# Patient Record
Sex: Female | Born: 1990 | Race: Black or African American | Hispanic: No | Marital: Single | Smoking: Current some day smoker
Health system: Southern US, Community
[De-identification: ages and names within clinical notes are randomized; demographics above are authoritative.]

## PROBLEM LIST (undated history)

## (undated) DIAGNOSIS — F909 Attention-deficit hyperactivity disorder, unspecified type: Secondary | ICD-10-CM

## (undated) DIAGNOSIS — F319 Bipolar disorder, unspecified: Secondary | ICD-10-CM

## (undated) DIAGNOSIS — G4700 Insomnia, unspecified: Secondary | ICD-10-CM

## (undated) DIAGNOSIS — N946 Dysmenorrhea, unspecified: Secondary | ICD-10-CM

## (undated) DIAGNOSIS — Z7689 Persons encountering health services in other specified circumstances: Secondary | ICD-10-CM

## (undated) HISTORY — DX: Persons encountering health services in other specified circumstances: Z76.89

## (undated) HISTORY — DX: Dysmenorrhea, unspecified: N94.6

## (undated) HISTORY — DX: Insomnia, unspecified: G47.00

## (undated) HISTORY — DX: Bipolar disorder, unspecified: F31.9

---
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
## 2008-09-10 ENCOUNTER — Emergency Department (HOSPITAL_COMMUNITY): Admission: EM | Admit: 2008-09-10 | Discharge: 2008-09-10 | Payer: Self-pay | Admitting: Emergency Medicine

## 2008-12-25 IMAGING — US US ABDOMEN COMPLETE
1 series · 14 of 25 positions shown · non-contrast
Comparison: None.

CLINICAL DATA: Vomiting.
 ABDOMEN ULTRASOUND:
TECHNIQUE: Complete abdominal ultrasound examination was performed including evaluation of the liver, gallbladder, bile ducts, pancreas, kidneys, spleen, IVC, and abdominal aorta.

[Series 1: unknown · 0.28mm/px · 14 of 57 slices shown]
[im 1/57]
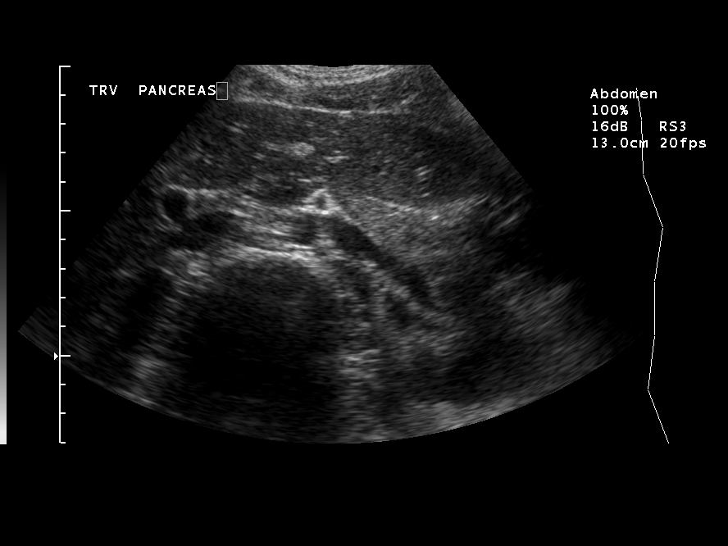
[im 5/57]
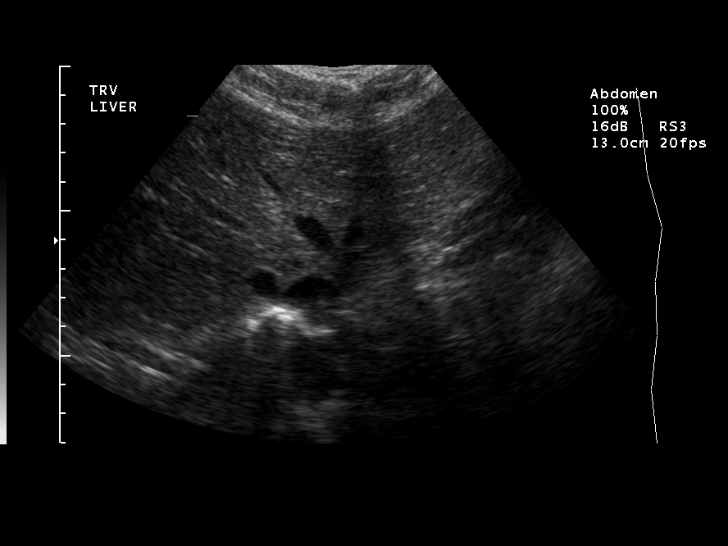
[im 10/57]
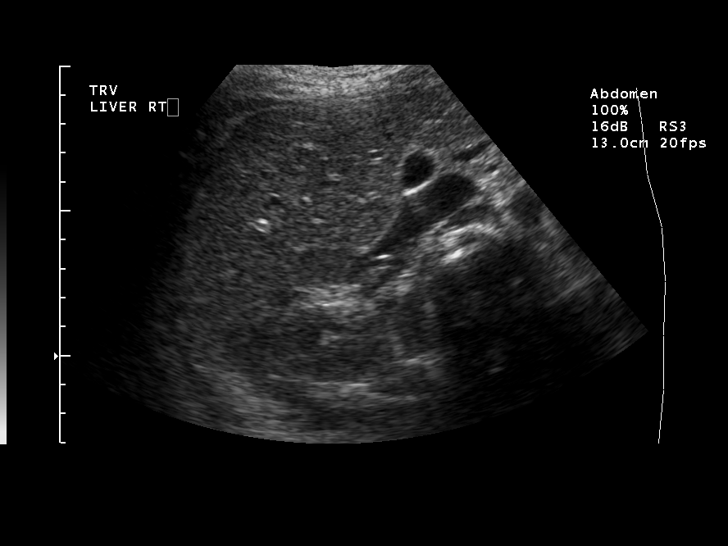
[im 15/57]
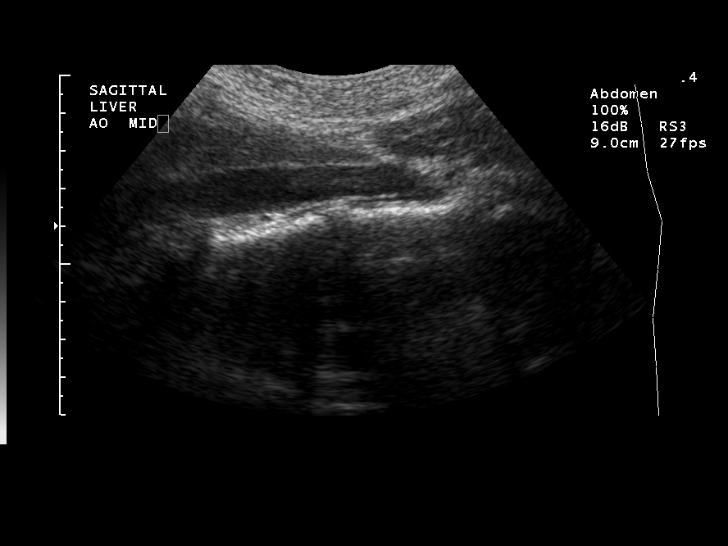
[im 19/57]
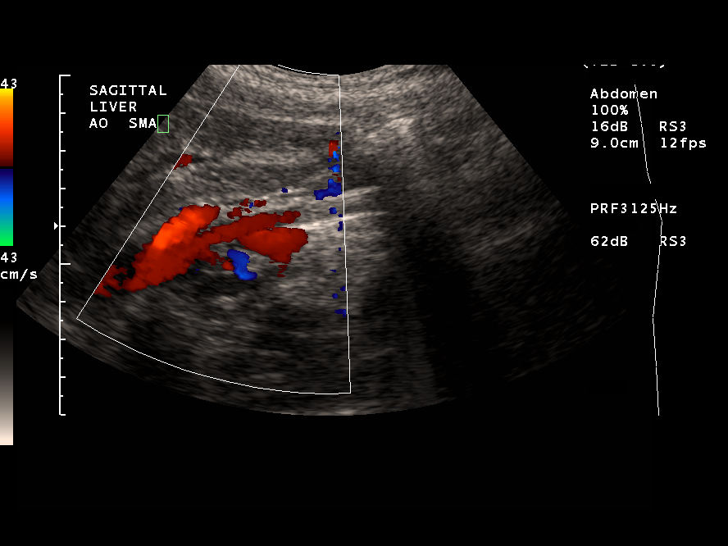
[im 22/57]
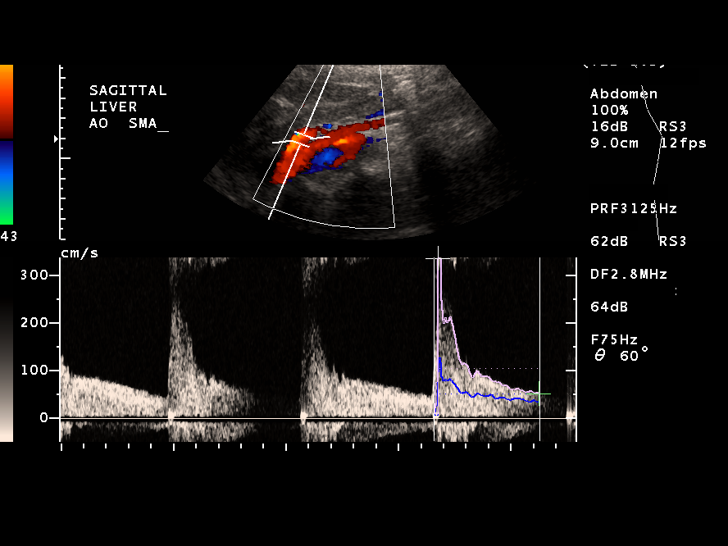
[im 26/57]
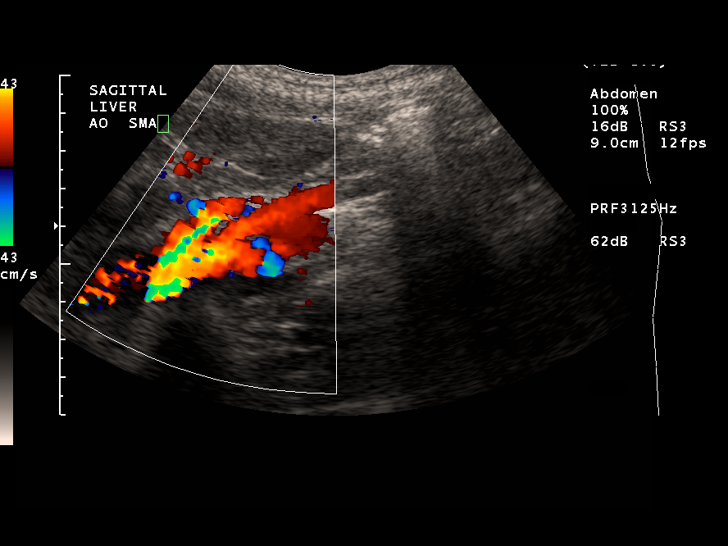
[im 31/57]
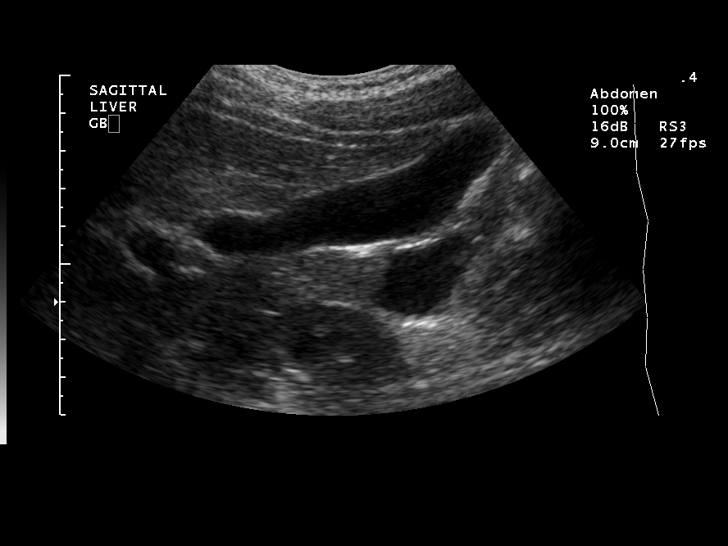
[im 36/57]
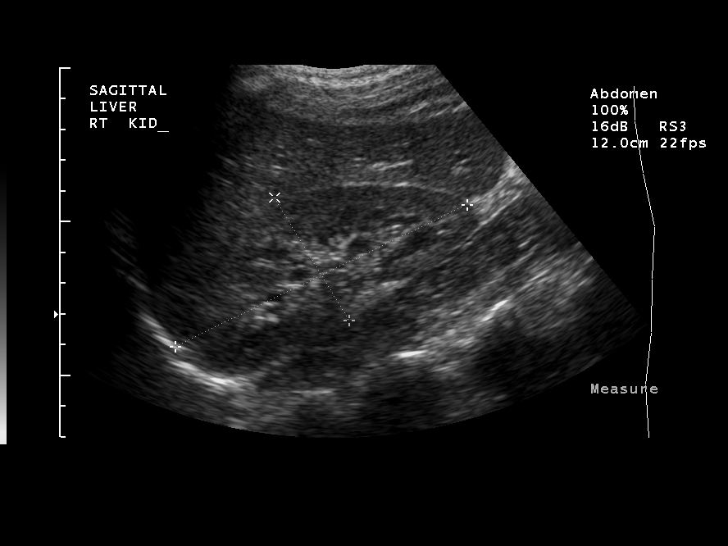
[im 38/57]
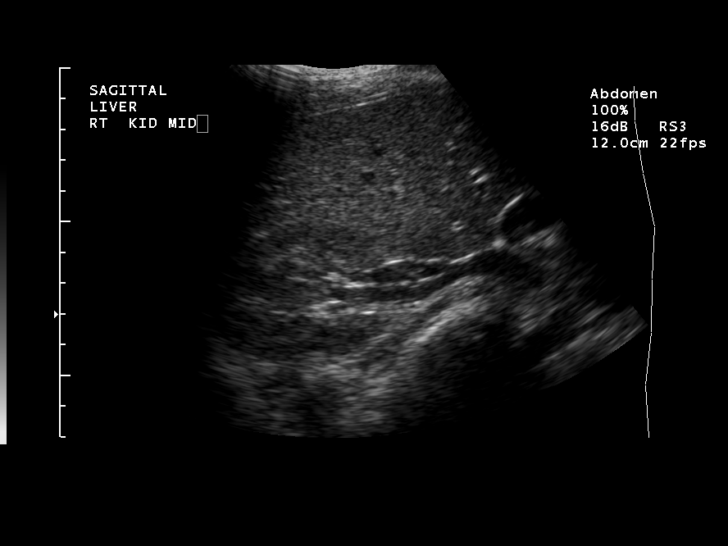
[im 43/57]
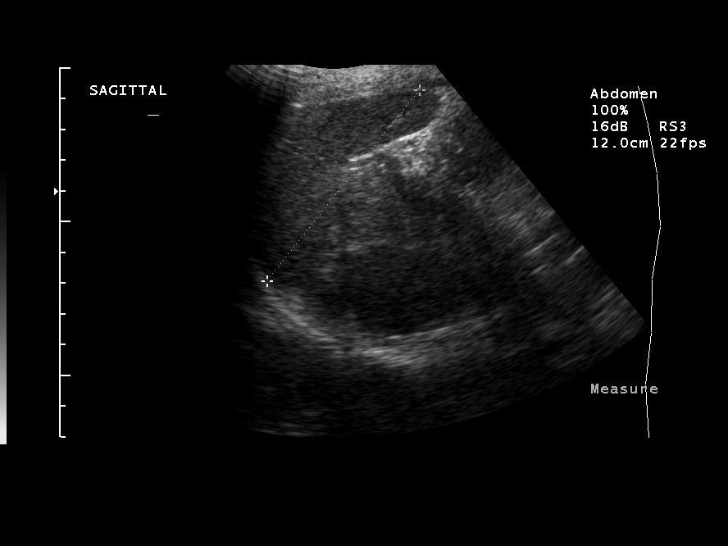
[im 47/57]
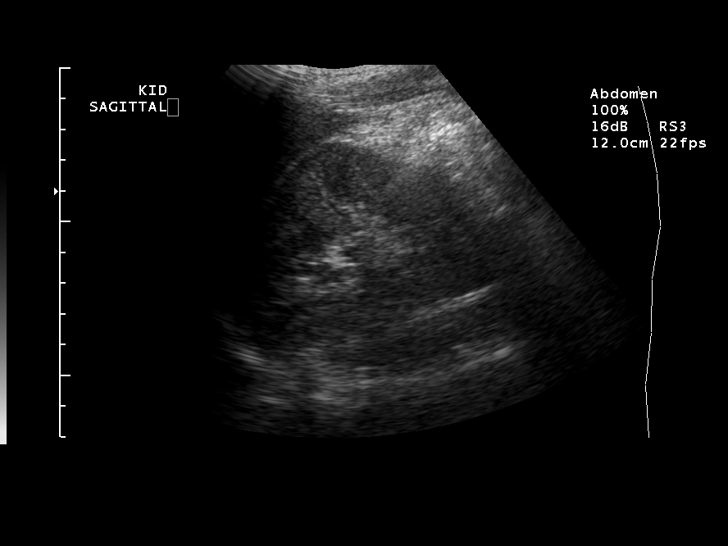
[im 52/57]
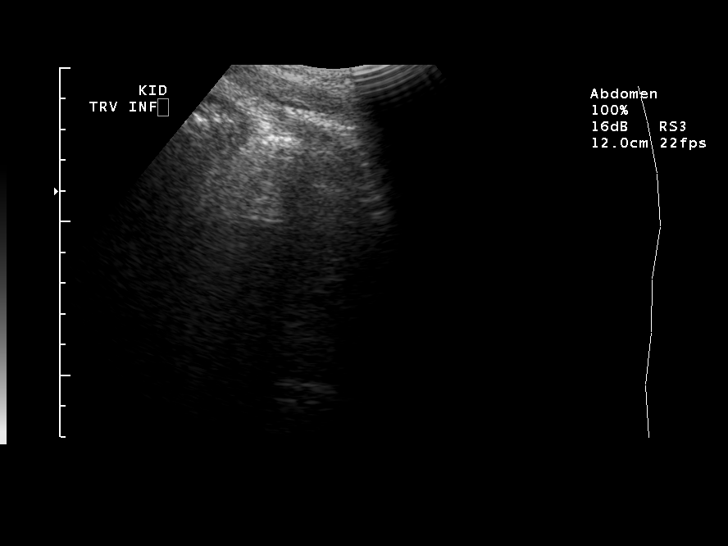
[im 57/57]
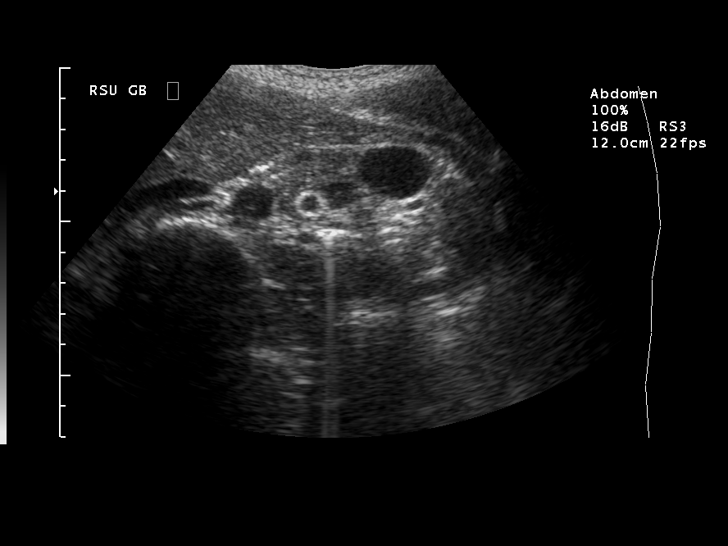

[14 of 25 positions shown; findings below may reference images not displayed]

FINDINGS: Gallbladder and bile ducts are normal.  The common duct is about 2.2 mm.  The liver, spleen, pancreas and kidneys are sonographically normal.  The aorta and IVC are normal.  No ascites.
IMPRESSION: No acute or significant findings.

## 2009-04-08 ENCOUNTER — Emergency Department (HOSPITAL_COMMUNITY): Admission: EM | Admit: 2009-04-08 | Discharge: 2009-04-08 | Payer: Self-pay | Admitting: Emergency Medicine

## 2009-04-11 ENCOUNTER — Emergency Department (HOSPITAL_COMMUNITY): Admission: EM | Admit: 2009-04-11 | Discharge: 2009-04-11 | Payer: Self-pay | Admitting: Emergency Medicine

## 2009-08-18 ENCOUNTER — Emergency Department (HOSPITAL_COMMUNITY): Admission: EM | Admit: 2009-08-18 | Discharge: 2009-08-18 | Payer: Self-pay | Admitting: Emergency Medicine

## 2010-09-25 LAB — CBC
HCT: 40.1 % (ref 36.0–46.0)
HCT: 40.6 % (ref 36.0–46.0)
Hemoglobin: 13.4 g/dL (ref 12.0–15.0)
Hemoglobin: 13.5 g/dL (ref 12.0–15.0)
MCHC: 32.9 g/dL (ref 30.0–36.0)
MCHC: 33.6 g/dL (ref 30.0–36.0)
MCV: 86.4 fL (ref 78.0–100.0)
MCV: 87.1 fL (ref 78.0–100.0)
RBC: 4.64 MIL/uL (ref 3.87–5.11)
RBC: 4.67 MIL/uL (ref 3.87–5.11)
RDW: 13.7 % (ref 11.5–15.5)
WBC: 6.3 10*3/uL (ref 4.0–10.5)
WBC: 7.7 10*3/uL (ref 4.0–10.5)

## 2010-09-25 LAB — DIFFERENTIAL
Basophils Absolute: 0 10*3/uL (ref 0.0–0.1)
Basophils Relative: 0 % (ref 0–1)
Eosinophils Absolute: 0 10*3/uL (ref 0.0–0.7)
Eosinophils Absolute: 0 10*3/uL (ref 0.0–0.7)
Eosinophils Relative: 0 % (ref 0–5)
Eosinophils Relative: 0 % (ref 0–5)
Lymphocytes Relative: 17 % (ref 12–46)
Lymphocytes Relative: 22 % (ref 12–46)
Lymphs Abs: 1.3 10*3/uL (ref 0.7–4.0)
Monocytes Absolute: 0.5 10*3/uL (ref 0.1–1.0)
Monocytes Absolute: 0.7 10*3/uL (ref 0.1–1.0)
Neutro Abs: 5.8 10*3/uL (ref 1.7–7.7)
Neutrophils Relative %: 67 % (ref 43–77)

## 2010-09-25 LAB — COMPREHENSIVE METABOLIC PANEL
Albumin: 4.3 g/dL (ref 3.5–5.2)
GFR calc Af Amer: >60 mL/min (ref >60)
GFR calc non Af Amer: >60 mL/min (ref >60)
Sodium: 140 mEq/L (ref 135–145)
Total Bilirubin: 0.5 mg/dL (ref 0.3–1.2)
Total Protein: 7.7 g/dL (ref 6.0–8.3)

## 2010-09-25 LAB — BASIC METABOLIC PANEL
Creatinine, Ser: 0.6 mg/dL (ref 0.4–1.2)
Glucose, Bld: 103 mg/dL — ABNORMAL HIGH (ref 70–99)

## 2010-09-25 LAB — URINE MICROSCOPIC-ADD ON

## 2010-09-25 LAB — URINALYSIS, ROUTINE W REFLEX MICROSCOPIC
Glucose, UA: NEGATIVE mg/dL
Ketones, ur: 40 mg/dL — AB
Leukocytes, UA: NEGATIVE
Nitrite: NEGATIVE
Protein, ur: 30 mg/dL — AB
Protein, ur: 30 mg/dL — AB
Specific Gravity, Urine: 1.015 (ref 1.005–1.030)
Specific Gravity, Urine: 1.02 (ref 1.005–1.030)
Urobilinogen, UA: 1 mg/dL (ref 0.0–1.0)

## 2010-09-25 LAB — PREGNANCY, URINE: Preg Test, Ur: NEGATIVE

## 2010-11-13 IMAGING — CR DG ABDOMEN ACUTE W/ 1V CHEST
3 series · 3 of 3 positions shown · non-contrast
Comparison: None

CLINICAL DATA: Vomiting.

ACUTE ABDOMEN SERIES (ABDOMEN 2 VIEW & CHEST 1 VIEW)

[view not recorded (1 of 3)]
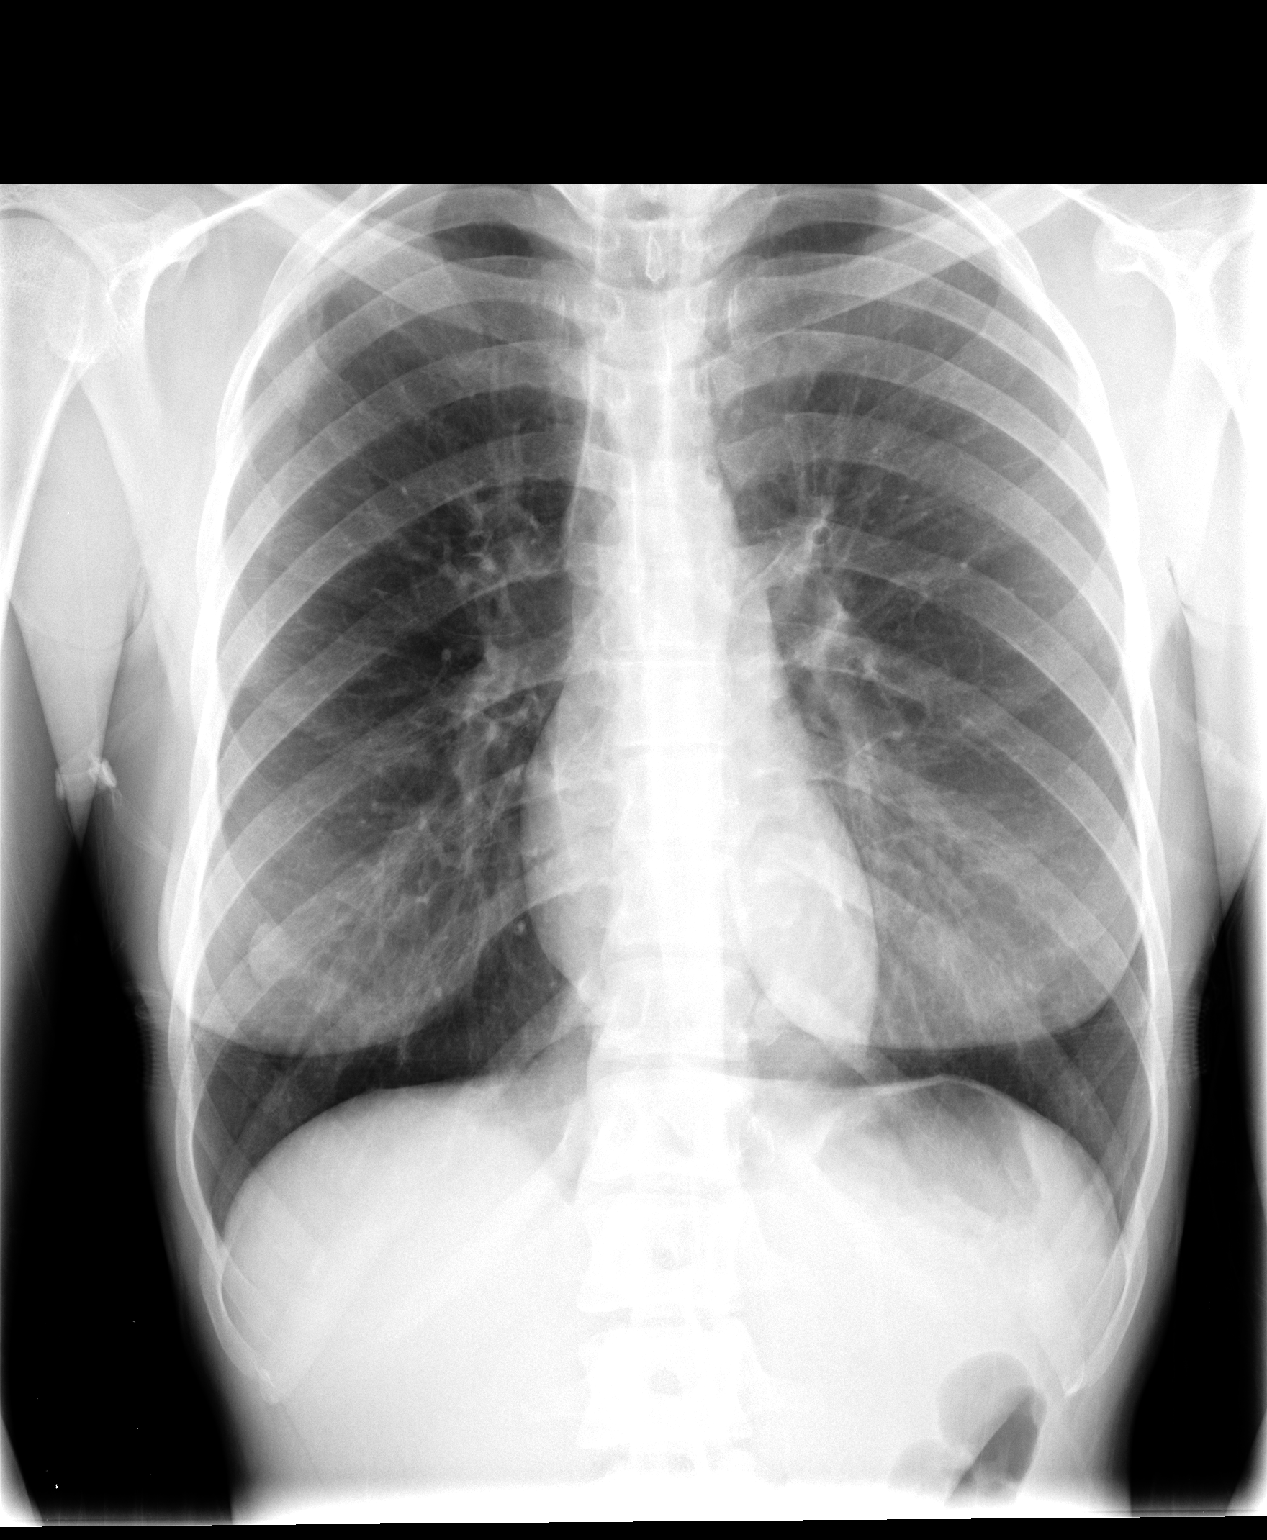

[view not recorded (2 of 3)]
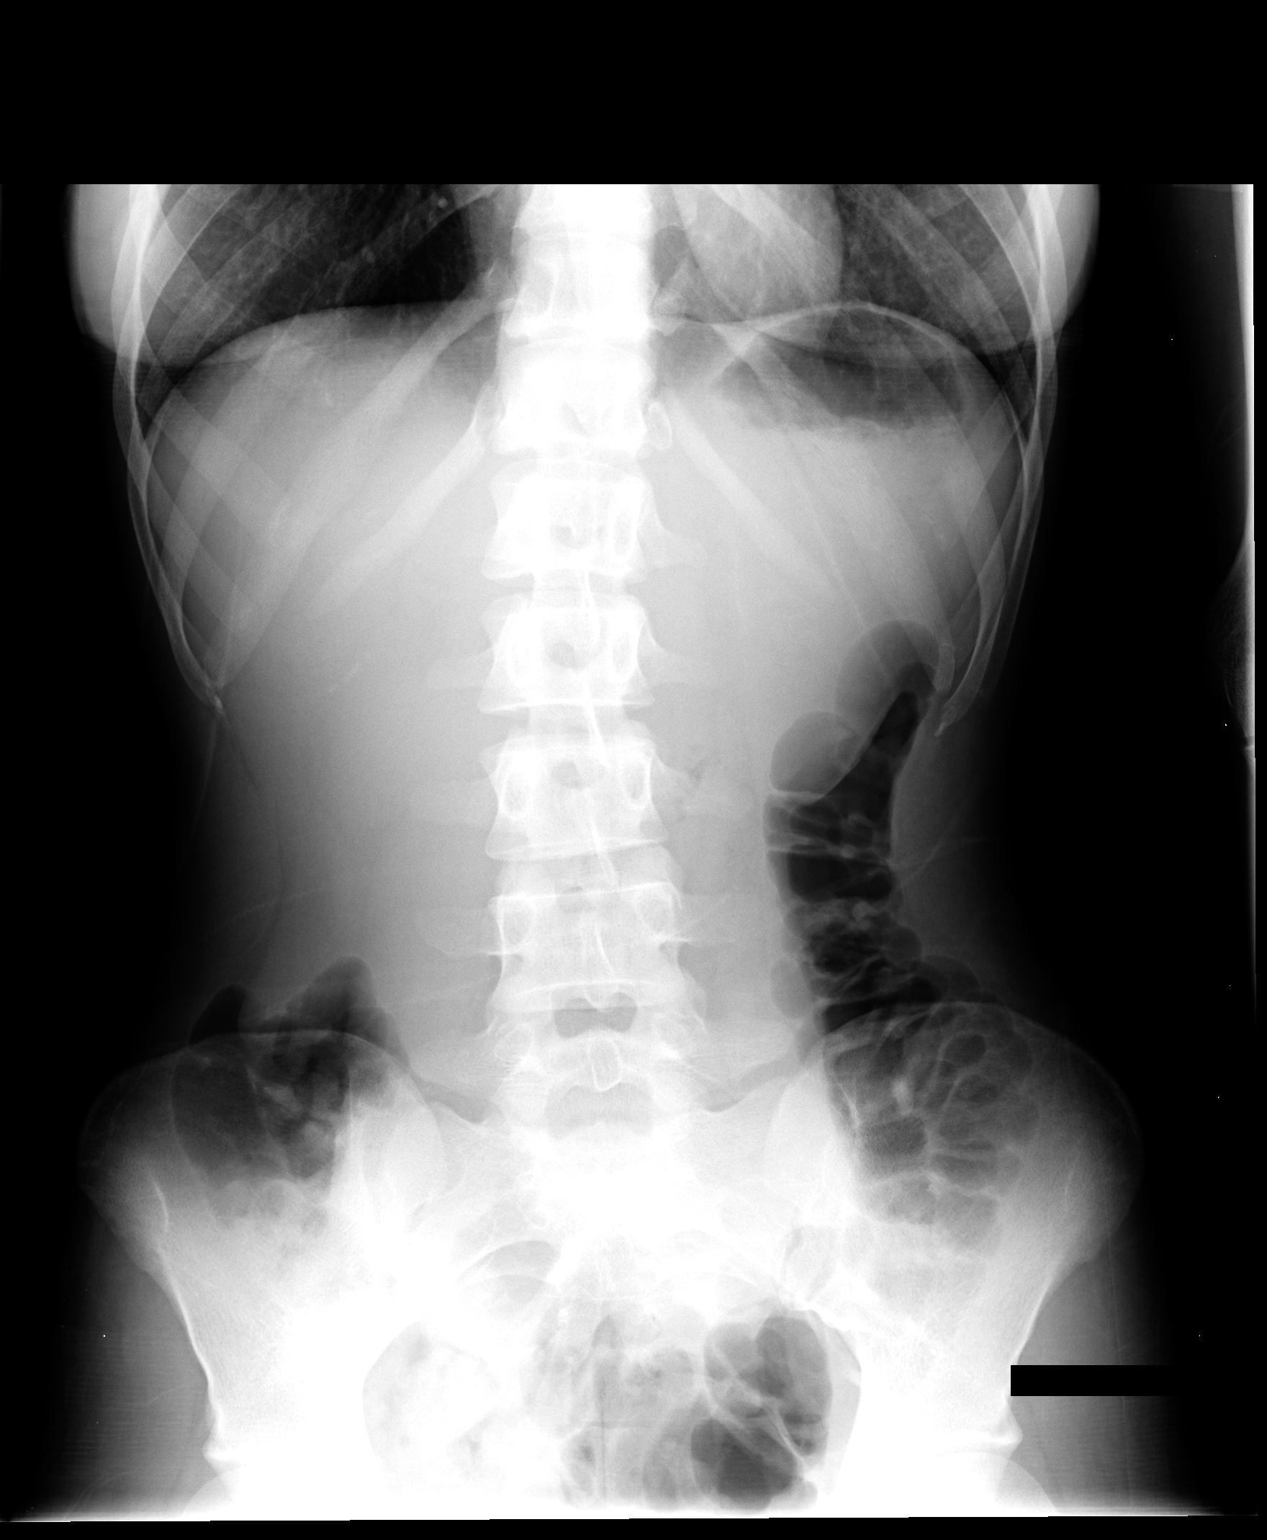

[view not recorded (3 of 3)]
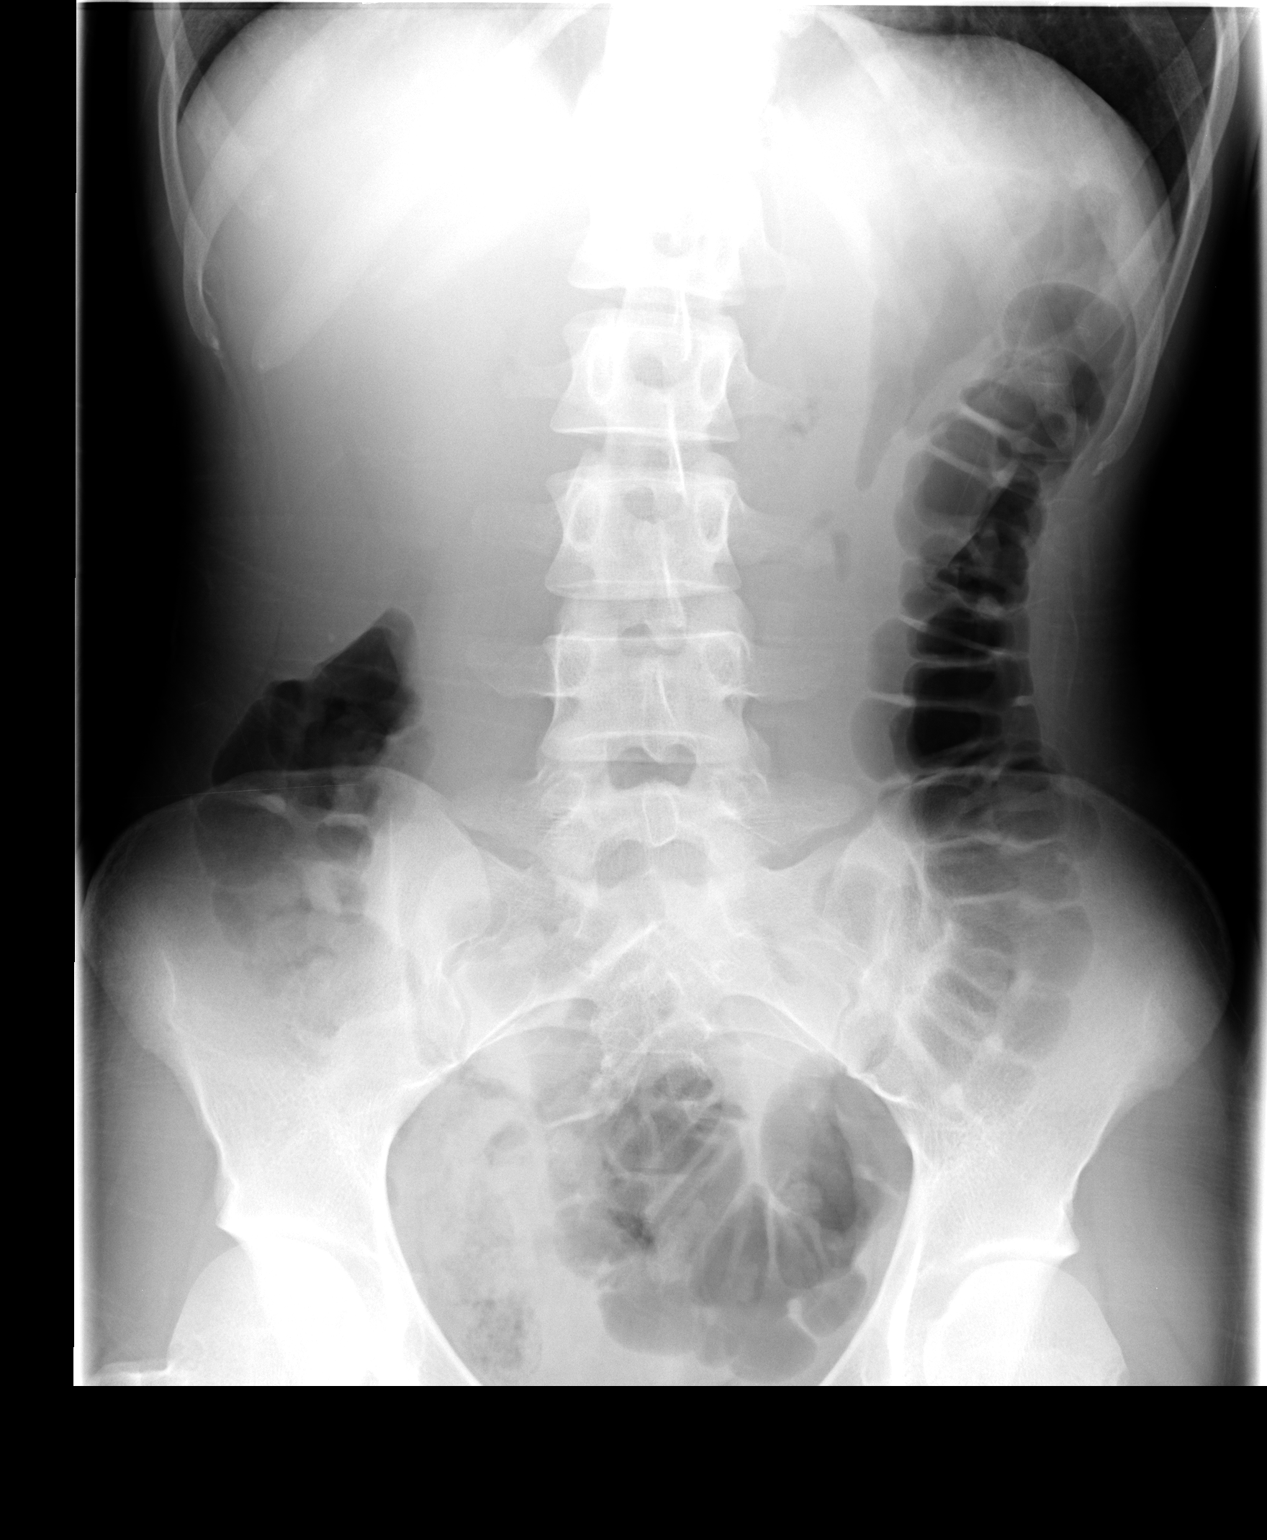

[3 of 3 positions shown; findings below may reference images not displayed]

FINDINGS: Chest is normal.  Moderate colonic distention.  Findings
are consistent with an ileus.  No findings to suggest bowel
obstruction.  No free air.  No unusual calcification.
IMPRESSION: Nonspecific colonic ileus.

## 2011-01-23 ENCOUNTER — Encounter: Payer: Self-pay | Admitting: *Deleted

## 2011-01-23 ENCOUNTER — Emergency Department (HOSPITAL_COMMUNITY)
Admission: EM | Admit: 2011-01-23 | Discharge: 2011-01-23 | Discharge status: Against Medical Advice | Payer: Medicaid Other | Attending: Emergency Medicine | Admitting: Emergency Medicine

## 2011-01-23 DIAGNOSIS — R1110 Vomiting, unspecified: Secondary | ICD-10-CM | POA: Insufficient documentation

## 2011-01-23 DIAGNOSIS — Z5320 Procedure and treatment not carried out because of patient's decision for unspecified reasons: Secondary | ICD-10-CM | POA: Insufficient documentation

## 2011-01-23 HISTORY — DX: Attention-deficit hyperactivity disorder, unspecified type: F90.9

## 2011-01-23 NOTE — ED Notes (Signed)
Pt reports she has been vomiting since this am, pt cc generalized weakness and muscle cramping

## 2011-01-24 ENCOUNTER — Emergency Department (HOSPITAL_COMMUNITY)
Admission: EM | Admit: 2011-01-24 | Discharge: 2011-01-24 | Disposition: A | Discharge status: Home / Self Care | Payer: Medicaid Other | Attending: Emergency Medicine | Admitting: Emergency Medicine

## 2011-01-24 ENCOUNTER — Encounter (HOSPITAL_COMMUNITY): Payer: Self-pay | Admitting: *Deleted

## 2011-01-24 DIAGNOSIS — F17200 Nicotine dependence, unspecified, uncomplicated: Secondary | ICD-10-CM | POA: Insufficient documentation

## 2011-01-24 DIAGNOSIS — R1110 Vomiting, unspecified: Secondary | ICD-10-CM | POA: Insufficient documentation

## 2011-01-24 DIAGNOSIS — A599 Trichomoniasis, unspecified: Secondary | ICD-10-CM

## 2011-01-24 DIAGNOSIS — R112 Nausea with vomiting, unspecified: Secondary | ICD-10-CM

## 2011-01-24 LAB — COMPREHENSIVE METABOLIC PANEL
AST: 18 U/L (ref 0–37)
CO2: 23 mEq/L (ref 19–32)
Calcium: 10.5 mg/dL (ref 8.4–10.5)
Chloride: 100 mEq/L (ref 96–112)
Creatinine, Ser: 0.5 mg/dL (ref 0.50–1.10)
Potassium: 3.3 mEq/L — ABNORMAL LOW (ref 3.5–5.1)
Sodium: 138 mEq/L (ref 135–145)

## 2011-01-24 LAB — URINALYSIS, ROUTINE W REFLEX MICROSCOPIC: pH: 6.5 (ref 5.0–8.0)

## 2011-01-24 LAB — CBC
Hemoglobin: 14 g/dL (ref 12.0–15.0)
MCH: 28.4 pg (ref 26.0–34.0)
MCV: 83.2 fL (ref 78.0–100.0)
RBC: 4.93 MIL/uL (ref 3.87–5.11)
RDW: 13.6 % (ref 11.5–15.5)

## 2011-01-24 LAB — DIFFERENTIAL
Basophils Absolute: 0 10*3/uL (ref 0.0–0.1)
Eosinophils Absolute: 0 10*3/uL (ref 0.0–0.7)
Eosinophils Relative: 0 % (ref 0–5)
Lymphocytes Relative: 17 % (ref 12–46)
Lymphs Abs: 1.4 10*3/uL (ref 0.7–4.0)
Monocytes Absolute: 0.7 10*3/uL (ref 0.1–1.0)
Monocytes Relative: 9 % (ref 3–12)
Neutro Abs: 6.1 10*3/uL (ref 1.7–7.7)

## 2011-01-24 LAB — URINE MICROSCOPIC-ADD ON

## 2011-01-24 MED ORDER — KETOROLAC TROMETHAMINE 30 MG/ML IJ SOLN
30.0000 mg | Freq: Once | INTRAMUSCULAR | Status: AC
  Administered 2011-01-24: 30 mg via INTRAVENOUS
  Filled 2011-01-24: qty 1

## 2011-01-24 MED ORDER — ONDANSETRON HCL 4 MG/2ML IJ SOLN
4.0000 mg | Freq: Once | INTRAMUSCULAR | Status: AC
  Administered 2011-01-24: 4 mg via INTRAVENOUS
  Filled 2011-01-24: qty 2

## 2011-01-24 MED ORDER — METRONIDAZOLE 500 MG PO TABS: 500.0000 mg | ORAL_TABLET | Freq: Two times a day (BID) | ORAL | Status: DC

## 2011-01-24 MED ORDER — SODIUM CHLORIDE 0.9 % IV SOLN: Freq: Once | INTRAVENOUS | Status: DC

## 2011-01-24 MED ORDER — CEFTRIAXONE SODIUM 250 MG IJ SOLR
250.0000 mg | Freq: Once | INTRAMUSCULAR | Status: AC
  Administered 2011-01-24: 250 mg via INTRAMUSCULAR
  Filled 2011-01-24: qty 250

## 2011-01-24 MED ORDER — AZITHROMYCIN 250 MG PO TABS
1000.0000 mg | ORAL_TABLET | Freq: Once | ORAL | Status: AC
  Administered 2011-01-24: 1000 mg via ORAL
  Filled 2011-01-24: qty 4

## 2011-01-24 MED ORDER — METRONIDAZOLE 500 MG PO TABS: 500.0000 mg | ORAL_TABLET | Freq: Two times a day (BID) | ORAL | Status: AC

## 2011-01-24 MED ORDER — LIDOCAINE HCL (PF) 1 % IJ SOLN
INTRAMUSCULAR | Status: AC
  Administered 2011-01-24: 5 mL
  Filled 2011-01-24: qty 5

## 2011-01-24 MED ORDER — AZITHROMYCIN 1 G PO PACK
1.0000 g | PACK | Freq: Once | ORAL | Status: DC
  Filled 2011-01-24: qty 1

## 2011-01-24 NOTE — ED Notes (Signed)
Pt tolerating ice chips with no c/o nausea or vomiting. Given trial of po Sprite. Resting with no complaints.

## 2011-01-24 NOTE — ED Notes (Signed)
Taking po fluids with no nausea or vomiting.

## 2011-01-24 NOTE — ED Provider Notes (Signed)
History     CSN: [id number] Arrival date & time: 01/24/2011  7:22 AM  Chief Complaint  Patient presents with  . Emesis    decreased appitite, cramping   Patient is a 20 y.o. female presenting with vomiting. The history is provided by the patient.  Emesis  This is a recurrent problem. The current episode started more than 2 days ago. The problem occurs 5 to 10 times per day. The problem has not changed since onset.The emesis has an appearance of stomach contents. There has been no fever. Pertinent negatives include no abdominal pain, no chills, no cough, no fever and no sweats.    Past Medical History  Diagnosis Date  . ADHD (attention deficit hyperactivity disorder)     History reviewed. No pertinent past surgical history.  History reviewed. No pertinent family history.  History  Substance Use Topics  . Smoking status: Current Everyday Smoker -- 0.5 packs/day    Types: Cigarettes  . Smokeless tobacco: Never Used  . Alcohol Use: Yes    OB History    Grav Para Term Preterm Abortions TAB SAB Ect Mult Living                  Review of Systems  Constitutional: Negative for fever and chills.  Respiratory: Negative for cough and shortness of breath.   Cardiovascular: Negative for chest pain and palpitations.  Gastrointestinal: Positive for vomiting. Negative for abdominal pain.  All other systems reviewed and are negative.    Physical Exam  BP 122/76  Pulse 63  Temp(Src) 98.9 F (37.2 C) (Oral)  Resp 18  Ht 5\' 5"  (1.651 m)  Wt 118 lb (53.524 kg)  BMI 19.64 kg/m2  SpO2 100%  LMP 01/23/2011  Physical Exam  Constitutional: She is oriented to person, place, and time. She appears well-developed and well-nourished. No distress.  HENT:  Head: Normocephalic and atraumatic.  Mouth/Throat: Oropharynx is clear and moist.  Neck: Neck supple.  Cardiovascular: Normal rate, regular rhythm and normal heart sounds.  Exam reveals no gallop and no friction rub.   No murmur  heard. Pulmonary/Chest: Effort normal and breath sounds normal. No respiratory distress.  Abdominal: Soft. Bowel sounds are normal. She exhibits no distension. There is no tenderness. There is no rebound and no guarding.  Musculoskeletal: Normal range of motion.  Neurological: She is alert and oriented to person, place, and time.  Skin: Skin is warm and dry. She is not diaphoretic.    ED Course  Procedures  MDM Urine shows trichomonas, but workup otherwise unremarkable.  Feels better.  Will treat with rocephin, zmax, and flagyl, follow up prn.        Geoffery Lyons, MD 01/24/11 1045

## 2011-01-24 NOTE — ED Notes (Signed)
Pt sleeping at intervals.

## 2011-01-24 NOTE — ED Notes (Signed)
Pt states she has been vomiting for 3 days and is now cramping and weak.

## 2011-01-24 NOTE — ED Notes (Signed)
Pt states that she has been having nausea and vomiting x 3 days. States that she feels very weak and cannot keep anything down. No active vomiting and patient asking for ice chips.

## 2011-01-24 NOTE — ED Notes (Signed)
Pt resting with no complaints at present. States that pain and nausea are better. Requesting po fluids.

## 2011-01-24 NOTE — ED Notes (Signed)
Family at bedside. 

## 2011-02-01 NOTE — ED Provider Notes (Signed)
History     CSN: [id number] Arrival date & time: 01/23/2011  1:31 AM  Chief Complaint  Patient presents with  . Emesis   HPI  Past Medical History  Diagnosis Date  . ADHD (attention deficit hyperactivity disorder)     History reviewed. No pertinent past surgical history.  No family history on file.  History  Substance Use Topics  . Smoking status: Current Everyday Smoker -- 0.5 packs/day    Types: Cigarettes  . Smokeless tobacco: Never Used  . Alcohol Use: Yes    OB History    Grav Para Term Preterm Abortions TAB SAB Ect Mult Living                  Review of Systems  Physical Exam  BP 98/86  Pulse 62  Temp(Src) 98.9 F (37.2 C) (Oral)  Resp 20  Ht 5\' 5"  (1.651 m)  Wt 118 lb (53.524 kg)  BMI 19.64 kg/m2  SpO2 97%  LMP 01/23/2011  Physical Exam  ED Course  Procedures  MDM Will treat for trichomonas, GC, chlamydia.  To return if worsens      Geoffery Lyons, MD 02/01/11 [phone]

## 2011-03-09 ENCOUNTER — Encounter (HOSPITAL_COMMUNITY): Payer: Self-pay | Admitting: *Deleted

## 2011-03-09 ENCOUNTER — Emergency Department (HOSPITAL_COMMUNITY)
Admission: EM | Admit: 2011-03-09 | Discharge: 2011-03-09 | Disposition: A | Discharge status: Home / Self Care | Payer: Medicaid Other | Attending: Emergency Medicine | Admitting: Emergency Medicine

## 2011-03-09 DIAGNOSIS — N390 Urinary tract infection, site not specified: Secondary | ICD-10-CM | POA: Insufficient documentation

## 2011-03-09 DIAGNOSIS — F909 Attention-deficit hyperactivity disorder, unspecified type: Secondary | ICD-10-CM | POA: Insufficient documentation

## 2011-03-09 DIAGNOSIS — A599 Trichomoniasis, unspecified: Secondary | ICD-10-CM | POA: Insufficient documentation

## 2011-03-09 DIAGNOSIS — F17200 Nicotine dependence, unspecified, uncomplicated: Secondary | ICD-10-CM | POA: Insufficient documentation

## 2011-03-09 LAB — URINALYSIS, ROUTINE W REFLEX MICROSCOPIC
Nitrite: NEGATIVE
Specific Gravity, Urine: >1.03 — ABNORMAL HIGH (ref 1.005–1.030)
Urobilinogen, UA: 1 mg/dL (ref 0.0–1.0)
pH: 6 (ref 5.0–8.0)

## 2011-03-09 LAB — URINE MICROSCOPIC-ADD ON

## 2011-03-09 LAB — PREGNANCY, URINE: Preg Test, Ur: NEGATIVE

## 2011-03-09 LAB — COMPREHENSIVE METABOLIC PANEL
ALT: 12 U/L (ref 0–35)
AST: 15 U/L (ref 0–37)
Calcium: 10 mg/dL (ref 8.4–10.5)
Potassium: 3 mEq/L — ABNORMAL LOW (ref 3.5–5.1)
Sodium: 138 mEq/L (ref 135–145)
Total Protein: 8.1 g/dL (ref 6.0–8.3)

## 2011-03-09 MED ORDER — PROMETHAZINE HCL 25 MG/ML IJ SOLN
12.5000 mg | Freq: Once | INTRAMUSCULAR | Status: AC
  Administered 2011-03-09: 12.5 mg via INTRAVENOUS
  Filled 2011-03-09: qty 1

## 2011-03-09 MED ORDER — PROMETHAZINE HCL 25 MG RE SUPP: 25.0000 mg | Freq: Four times a day (QID) | RECTAL | Status: DC | PRN

## 2011-03-09 MED ORDER — SULFAMETHOXAZOLE-TRIMETHOPRIM 800-160 MG PO TABS: 1.0000 | ORAL_TABLET | Freq: Two times a day (BID) | ORAL | Status: AC

## 2011-03-09 MED ORDER — METRONIDAZOLE 500 MG PO TABS
2000.0000 mg | ORAL_TABLET | Freq: Once | ORAL | Status: AC
  Administered 2011-03-09: 2000 mg via ORAL
  Filled 2011-03-09: qty 4

## 2011-03-09 MED ORDER — SODIUM CHLORIDE 0.9 % IV BOLUS (SEPSIS)
1000.0000 mL | Freq: Once | INTRAVENOUS | Status: AC
  Administered 2011-03-09: 1000 mL via INTRAVENOUS

## 2011-03-09 NOTE — ED Provider Notes (Signed)
History     CSN: [id number] Arrival date & time: 03/09/2011  1:16 PM   Chief Complaint  Patient presents with  . Emesis     (Include location/radiation/quality/duration/timing/severity/associated sxs/prior treatment) HPI Comments: She presents with uncontrolled emesis for the past 3 days,  Stating the same thing happened last month when she had her period,  Too. Her period started 3 days ago. She denies fevers,  Denies vaginal discharge and pain.  She has been unable to keep down her phenergan which she was prescribed at her last visit for similar sx.  Patient is a 20 y.o. female presenting with vomiting. The history is provided by the patient and a relative.  Emesis  This is a recurrent problem. The current episode started more than 2 days ago. The problem has not changed since onset.There has been no fever. Pertinent negatives include no abdominal pain, no arthralgias, no chills, no diarrhea, no fever, no headaches and no myalgias.     Past Medical History  Diagnosis Date  . ADHD (attention deficit hyperactivity disorder)      History reviewed. No pertinent past surgical history.  No family history on file.  History  Substance Use Topics  . Smoking status: Current Everyday Smoker -- 0.5 packs/day for 5 years    Types: Cigarettes  . Smokeless tobacco: Never Used  . Alcohol Use: Yes     occasional drinker 4-6 beers a week    OB History    Grav Para Term Preterm Abortions TAB SAB Ect Mult Living                  Review of Systems  Constitutional: Negative for fever and chills.  HENT: Negative for congestion, sore throat and neck pain.   Eyes: Negative.   Respiratory: Negative for chest tightness and shortness of breath.   Cardiovascular: Negative for chest pain.  Gastrointestinal: Positive for vomiting. Negative for nausea, abdominal pain and diarrhea.  Genitourinary: Positive for menstrual problem. Negative for urgency, frequency, hematuria, flank pain, vaginal  discharge and vaginal pain.  Musculoskeletal: Negative for myalgias, joint swelling and arthralgias.  Skin: Negative.  Negative for rash and wound.  Neurological: Negative for dizziness, weakness, light-headedness, numbness and headaches.  Hematological: Negative.   Psychiatric/Behavioral: Negative.     Allergies  Review of patient's allergies indicates no known allergies.  Home Medications   Current Outpatient Rx  Name Route Sig Dispense Refill  . ATOMOXETINE HCL 80 MG PO CAPS Oral Take 80 mg by mouth daily.      Marland Kitchen ZOLPIDEM TARTRATE 10 MG PO TABS Oral Take 10 mg by mouth at bedtime.        Physical Exam    BP 123/80  Pulse 65  Temp(Src) 97.8 F (36.6 C) (Oral)  Resp 20  Ht 5\' 6"  (1.676 m)  Wt 110 lb (49.896 kg)  BMI 17.75 kg/m2  SpO2 100%  LMP 03/09/2011  Physical Exam  Nursing note and vitals reviewed. Constitutional: She is oriented to person, place, and time. She appears well-developed and well-nourished.  HENT:  Head: Normocephalic and atraumatic.  Eyes: Conjunctivae are normal.  Neck: Normal range of motion.  Cardiovascular: Normal rate, regular rhythm, normal heart sounds and intact distal pulses.   Pulmonary/Chest: Effort normal and breath sounds normal. She has no wheezes.  Abdominal: Soft. Bowel sounds are normal. She exhibits no mass. There is no tenderness. There is no rebound and no guarding.  Musculoskeletal: Normal range of motion.  Neurological: She is alert and  oriented to person, place, and time.  Skin: Skin is warm and dry.  Psychiatric: She has a normal mood and affect.    ED Course  Procedures  Results for orders placed during the hospital encounter of 03/09/11  URINALYSIS, ROUTINE W REFLEX MICROSCOPIC      Component Value Range   Color, Urine YELLOW  YELLOW    Appearance CLEAR  CLEAR    Specific Gravity, Urine >1.030 (*) 1.005 - 1.030    pH 6.0  5.0 - 8.0    Glucose, UA NEGATIVE  NEGATIVE (mg/dL)   Hgb urine dipstick LARGE (*) NEGATIVE     Bilirubin Urine SMALL (*) NEGATIVE    Ketones, ur 15 (*) NEGATIVE (mg/dL)   Protein, ur 30 (*) NEGATIVE (mg/dL)   Urobilinogen, UA 1.0  0.0 - 1.0 (mg/dL)   Nitrite NEGATIVE  NEGATIVE    Leukocytes, UA NEGATIVE  NEGATIVE   PREGNANCY, URINE      Component Value Range   Preg Test, Ur NEGATIVE    COMPREHENSIVE METABOLIC PANEL      Component Value Range   Sodium 138  135 - 145 (mEq/L)   Potassium 3.0 (*) 3.5 - 5.1 (mEq/L)   Chloride 98  96 - 112 (mEq/L)   CO2 25  19 - 32 (mEq/L)   Glucose, Bld 104 (*) 70 - 99 (mg/dL)   BUN 13  6 - 23 (mg/dL)   Creatinine, Ser 4.09 (*) 0.50 - 1.10 (mg/dL)   Calcium 81.1  8.4 - 10.5 (mg/dL)   Total Protein 8.1  6.0 - 8.3 (g/dL)   Albumin 4.6  3.5 - 5.2 (g/dL)   AST 15  0 - 37 (U/L)   ALT 12  0 - 35 (U/L)   Alkaline Phosphatase 68  39 - 117 (U/L)   Total Bilirubin 0.4  0.3 - 1.2 (mg/dL)   GFR calc non Af Amer >60  >60 (mL/min)   GFR calc Af Amer >60  >60 (mL/min)  LIPASE, BLOOD      Component Value Range   Lipase 26  11 - 59 (U/L)  URINE MICROSCOPIC-ADD ON      Component Value Range   Squamous Epithelial / LPF FEW (*) RARE    WBC, UA 11-20  <3 (WBC/hpf)   RBC / HPF 21-50  <3 (RBC/hpf)   Bacteria, UA FEW (*) RARE    Urine-Other TRICHOMONAS PRESENT     No results found.   No diagnosis found.   MDM Uti, trichomonas.  Prior ed visit reviewed.  Patient was treated for gc/chlamydia while in ed,  Prescribed flagyl for trich which she states she was unable to keep down,  So did not take.  Denies vaginal discharge and pelvic pain. Flagyl 2 gram PO given today.  Will tx for uti.  Phenergan suppositories (has phenergan tabs at home).  Referral to gyn.      Candis Musa, PA 03/09/11 1538

## 2011-03-09 NOTE — ED Notes (Signed)
Pt c/o N/V x 3 days.  States is unable to" keep anything down since Friday".  Pt reports just emesis. Denies diarrhea, fever and chills.

## 2011-03-09 NOTE — ED Notes (Signed)
Pt c/o being sick with n/'v all weekend, generalized weakness

## 2011-03-09 NOTE — ED Notes (Signed)
Patient states she is feeling a lot better.

## 2011-03-09 NOTE — ED Provider Notes (Signed)
Medical screening examination/treatment/procedure(s) were performed by non-physician practitioner and as supervising physician I was immediately available for consultation/collaboration.  Nicholes Stairs, MD 03/09/11 1623

## 2011-03-16 LAB — COMPREHENSIVE METABOLIC PANEL
ALT: 18
AST: 22
Albumin: 4.5
Alkaline Phosphatase: 70
Chloride: 103
GFR calc Af Amer: >60
Potassium: 3.2 — ABNORMAL LOW
Sodium: 137
Total Bilirubin: 0.7

## 2011-03-16 LAB — PREGNANCY, URINE: Preg Test, Ur: NEGATIVE

## 2011-03-16 LAB — BASIC METABOLIC PANEL
BUN: 4 — ABNORMAL LOW
GFR calc non Af Amer: >60
Glucose, Bld: 122 — ABNORMAL HIGH
Potassium: 3.7

## 2011-03-16 LAB — URINALYSIS, ROUTINE W REFLEX MICROSCOPIC
Bilirubin Urine: NEGATIVE
Ketones, ur: NEGATIVE
Nitrite: NEGATIVE
Protein, ur: NEGATIVE
Urobilinogen, UA: 0.2

## 2011-03-16 LAB — CBC
HCT: 36.1
HCT: 41
Platelets: 180
Platelets: 193
RDW: 13.6
WBC: 8.3

## 2011-03-16 LAB — DIFFERENTIAL
Basophils Absolute: 0
Basophils Absolute: 0
Basophils Relative: 0
Eosinophils Absolute: 0
Eosinophils Relative: 0
Eosinophils Relative: 0
Lymphocytes Relative: 13
Monocytes Absolute: 0.5

## 2011-03-30 LAB — URINALYSIS, ROUTINE W REFLEX MICROSCOPIC
Glucose, UA: NEGATIVE
Ketones, ur: 15 — AB
Protein, ur: 30 — AB
Protein, ur: 30 — AB
Urobilinogen, UA: 0.2
Urobilinogen, UA: 1

## 2011-03-30 LAB — DIFFERENTIAL
Basophils Relative: 0
Eosinophils Absolute: 0 — ABNORMAL LOW
Eosinophils Relative: 0
Monocytes Relative: 8
Neutrophils Relative %: 75

## 2011-03-30 LAB — COMPREHENSIVE METABOLIC PANEL
ALT: 12
Alkaline Phosphatase: 63
CO2: 24
Chloride: 105
GFR calc non Af Amer: >60
Glucose, Bld: 125 — ABNORMAL HIGH
Potassium: 3.1 — ABNORMAL LOW
Sodium: 138

## 2011-03-30 LAB — LIPASE, BLOOD: Lipase: 26

## 2011-03-30 LAB — URINE MICROSCOPIC-ADD ON

## 2011-03-30 LAB — CBC
Hemoglobin: 13.1
RBC: 4.69
WBC: 6.4

## 2011-03-31 LAB — CBC
MCHC: 32.7
Platelets: 207
RBC: 4.57

## 2011-03-31 LAB — DIFFERENTIAL
Basophils Absolute: 0
Basophils Relative: 0
Eosinophils Absolute: 0 — ABNORMAL LOW
Monocytes Relative: 3
Neutro Abs: 10.7 — ABNORMAL HIGH
Neutrophils Relative %: 91 — ABNORMAL HIGH

## 2011-03-31 LAB — BASIC METABOLIC PANEL
BUN: 10
CO2: 21
Calcium: 9.9
Creatinine, Ser: 0.74
GFR calc Af Amer: >60

## 2011-04-23 ENCOUNTER — Other Ambulatory Visit (HOSPITAL_COMMUNITY)
Admission: RE | Admit: 2011-04-23 | Discharge: 2011-04-23 | Disposition: A | Discharge status: Home / Self Care | Payer: Medicaid Other | Source: Ambulatory Visit | Attending: Obstetrics and Gynecology | Admitting: Obstetrics and Gynecology

## 2011-04-23 ENCOUNTER — Other Ambulatory Visit: Payer: Self-pay | Admitting: Adult Health

## 2011-04-23 DIAGNOSIS — Z113 Encounter for screening for infections with a predominantly sexual mode of transmission: Secondary | ICD-10-CM | POA: Insufficient documentation

## 2011-04-23 DIAGNOSIS — Z01419 Encounter for gynecological examination (general) (routine) without abnormal findings: Secondary | ICD-10-CM | POA: Insufficient documentation

## 2011-08-24 ENCOUNTER — Emergency Department (HOSPITAL_COMMUNITY)
Admission: EM | Admit: 2011-08-24 | Discharge: 2011-08-24 | Disposition: A | Discharge status: Home / Self Care | Payer: Medicaid Other | Attending: Emergency Medicine | Admitting: Emergency Medicine

## 2011-08-24 ENCOUNTER — Encounter (HOSPITAL_COMMUNITY): Payer: Self-pay | Admitting: *Deleted

## 2011-08-24 DIAGNOSIS — R112 Nausea with vomiting, unspecified: Secondary | ICD-10-CM | POA: Insufficient documentation

## 2011-08-24 DIAGNOSIS — F17200 Nicotine dependence, unspecified, uncomplicated: Secondary | ICD-10-CM | POA: Insufficient documentation

## 2011-08-24 LAB — URINALYSIS, ROUTINE W REFLEX MICROSCOPIC
Glucose, UA: NEGATIVE mg/dL
Hgb urine dipstick: NEGATIVE
Ketones, ur: 15 mg/dL — AB
Nitrite: NEGATIVE
Protein, ur: 30 mg/dL — AB
Specific Gravity, Urine: 1.025 (ref 1.005–1.030)
Urobilinogen, UA: 1 mg/dL (ref 0.0–1.0)
pH: 6 (ref 5.0–8.0)

## 2011-08-24 LAB — URINE MICROSCOPIC-ADD ON

## 2011-08-24 LAB — PREGNANCY, URINE: Preg Test, Ur: NEGATIVE

## 2011-08-24 MED ORDER — SODIUM CHLORIDE 0.9 % IV BOLUS (SEPSIS)
1000.0000 mL | Freq: Once | INTRAVENOUS | Status: AC
  Administered 2011-08-24: 1000 mL via INTRAVENOUS

## 2011-08-24 MED ORDER — ONDANSETRON HCL 4 MG PO TABS: 4.0000 mg | ORAL_TABLET | Freq: Four times a day (QID) | ORAL | Status: AC

## 2011-08-24 MED ORDER — CEFTRIAXONE SODIUM 1 G IJ SOLR
1.0000 g | Freq: Once | INTRAMUSCULAR | Status: AC
  Administered 2011-08-24: 1 g via INTRAMUSCULAR
  Filled 2011-08-24: qty 10

## 2011-08-24 MED ORDER — ONDANSETRON HCL 4 MG/2ML IJ SOLN
4.0000 mg | Freq: Once | INTRAMUSCULAR | Status: AC
  Administered 2011-08-24: 4 mg via INTRAVENOUS
  Filled 2011-08-24: qty 2

## 2011-08-24 MED ORDER — SULFAMETHOXAZOLE-TRIMETHOPRIM 800-160 MG PO TABS: 1.0000 | ORAL_TABLET | Freq: Two times a day (BID) | ORAL | Status: AC

## 2011-08-24 MED ORDER — LIDOCAINE HCL (PF) 1 % IJ SOLN
INTRAMUSCULAR | Status: AC
  Administered 2011-08-24: 2.1 mL via INTRAMUSCULAR
  Filled 2011-08-24: qty 5

## 2011-08-24 NOTE — ED Notes (Signed)
Pt states she got really drunk on Thursday night and has not been able to keep anything down.

## 2011-08-24 NOTE — Discharge Instructions (Signed)
B.R.A.T. Diet Your doctor has recommended the B.R.A.T. diet for you or your child until the condition improves. This is often used to help control diarrhea and vomiting symptoms. If you or your child can tolerate clear liquids, you may have:  Bananas.   Rice.   Applesauce.   Toast (and other simple starches such as crackers, potatoes, noodles).  Be sure to avoid dairy products, meats, and fatty foods until symptoms are better. Fruit juices such as apple, grape, and prune juice can make diarrhea worse. Avoid these. Continue this diet for 2 days or as instructed by your caregiver. Document Released: 06/08/2005 Document Revised: 05/28/2011 Document Reviewed: 11/25/2006 ExitCare Patient Information 2012 [employer].Nausea and Vomiting Nausea is a sick feeling that often comes before throwing up (vomiting). Vomiting is a reflex where stomach contents come out of your mouth. Vomiting can cause severe loss of body fluids (dehydration). Children and elderly adults can become dehydrated quickly, especially if they also have diarrhea. Nausea and vomiting are symptoms of a condition or disease. It is important to find the cause of your symptoms. CAUSES   Direct irritation of the stomach lining. This irritation can result from increased acid production (gastroesophageal reflux disease), infection, food poisoning, taking certain medicines (such as nonsteroidal anti-inflammatory drugs), alcohol use, or tobacco use.   Signals from the brain.These signals could be caused by a headache, heat exposure, an inner ear disturbance, increased pressure in the brain from injury, infection, a tumor, or a concussion, pain, emotional stimulus, or metabolic problems.   An obstruction in the gastrointestinal tract (bowel obstruction).   Illnesses such as diabetes, hepatitis, gallbladder problems, appendicitis, kidney problems, cancer, sepsis, atypical symptoms of a heart attack, or eating disorders.   Medical  treatments such as chemotherapy and radiation.   Receiving medicine that makes you sleep (general anesthetic) during surgery.  DIAGNOSIS Your caregiver may ask for tests to be done if the problems do not improve after a few days. Tests may also be done if symptoms are severe or if the reason for the nausea and vomiting is not clear. Tests may include:  Urine tests.   Blood tests.   Stool tests.   Cultures (to look for evidence of infection).   X-rays or other imaging studies.  Test results can help your caregiver make decisions about treatment or the need for additional tests. TREATMENT You need to stay well hydrated. Drink frequently but in small amounts.You may wish to drink water, sports drinks, clear broth, or eat frozen ice pops or gelatin dessert to help stay hydrated.When you eat, eating slowly may help prevent nausea.There are also some antinausea medicines that may help prevent nausea. HOME CARE INSTRUCTIONS   Take all medicine as directed by your caregiver.   If you do not have an appetite, do not force yourself to eat. However, you must continue to drink fluids.   If you have an appetite, eat a normal diet unless your caregiver tells you differently.   Eat a variety of complex carbohydrates (rice, wheat, potatoes, bread), lean meats, yogurt, fruits, and vegetables.   Avoid high-fat foods because they are more difficult to digest.   Drink enough water and fluids to keep your urine clear or pale yellow.   If you are dehydrated, ask your caregiver for specific rehydration instructions. Signs of dehydration may include:   Severe thirst.   Dry lips and mouth.   Dizziness.   Dark urine.   Decreasing urine frequency and amount.   Confusion.     Rapid breathing or pulse.  SEEK IMMEDIATE MEDICAL CARE IF:   You have blood or brown flecks (like coffee grounds) in your vomit.   You have black or bloody stools.   You have a severe headache or stiff neck.   You  are confused.   You have severe abdominal pain.   You have chest pain or trouble breathing.   You do not urinate at least once every 8 hours.   You develop cold or clammy skin.   You continue to vomit for longer than 24 to 48 hours.   You have a fever.  MAKE SURE YOU:   Understand these instructions.   Will watch your condition.   Will get help right away if you are not doing well or get worse.  Document Released: 06/08/2005 Document Revised: 05/28/2011 Document Reviewed: 11/05/2010 ExitCare Patient Information 2012 [employer].  RESOURCE GUIDE  Dental Problems  Patients with Medicaid: [facility]                     [facility] [address]                                           [address] Phone:  [phone]                                                  Phone:  [phone]  If unable to pay or uninsured, contact:  [facility] or [facility] to become qualified for the adult dental clinic.  Chronic Pain Problems Contact [facility]  [phone] Patients need to be referred by their primary care doctor.  Insufficient Money for Medicine Contact [employer]:  call "211" or [facility] 271-5999.  No Primary Care Doctor Call [facility]  832-8000 Other agencies that provide inexpensive medical care    [facility]  [phone]    [facility]  [phone]    [facility]  [phone]    Women[facility] Clinic  [phone]    [facility]  [phone]    [facility]  [phone]  Psychological Services [facility]  [phone] [facility]  [phone] [facility]   800 853-5163 (emergency services 641-4993)  Substance Abuse Resources [facility]  336-882-2125 [facility] 336-784-9470 [facility] 336-285-9073 [facility] 336-845-3988 Residential & Outpatient Substance Abuse Program   800-659-3381  Abuse/Neglect [place] Child Abuse Hotline (336) 641-3795 [place] Child Abuse Hotline 800-378-5315 (After Hours)  Emergency Shelter [facility] (336) 271-5985  [facility] (336) 275-9566 [facility] (704) 372-4663  MRSA Hotline #:   832-7006    [place] [facility]  [facility] [place]     [employer]                          [facility] [address] [place]                       [address]        [address]  [place]                                                [place]                            [place] Phone:  [phone]                                   Phone:  [phone]                 Phone:  [phone]  [facility] Phone:  [phone]  [place] Child Abuse Hotline (336) 342-1394 (336) 342-3537 (After Hours)   

## 2011-08-24 NOTE — ED Provider Notes (Signed)
History    21 year old female with nausea and vomiting. Onset was Friday morning. Patient states she went on Thursday night and got "really, really drunk." We'll cover next morning with nausea and vomiting. Has been persistent since then. Denies any significant abdominal pain. No fever or chills. No diarrhea. No urinary complaints. Social period was November when she was started on birth control. No unusual vaginal bleeding or discharge. Does not think she is pregnant. No contacts with similar symptoms. Denies history of abdominal surgery.  CSN: [id number]  Arrival date & time 08/24/11  [id number]   First MD Initiated Contact with Patient 08/24/11 0534      Chief Complaint  Patient presents with  . Nausea  . Emesis    (Consider location/radiation/quality/duration/timing/severity/associated sxs/prior treatment) HPI  Past Medical History  Diagnosis Date  . ADHD (attention deficit hyperactivity disorder)     History reviewed. No pertinent past surgical history.  No family history on file.  History  Substance Use Topics  . Smoking status: Current Everyday Smoker -- 0.5 packs/day for 5 years    Types: Cigarettes  . Smokeless tobacco: Never Used  . Alcohol Use: Yes     occasional drinker 4-6 beers a week    OB History    Grav Para Term Preterm Abortions TAB SAB Ect Mult Living                  Review of Systems   Review of symptoms negative unless otherwise noted in HPI.   Allergies  Review of patient's allergies indicates no known allergies.  Home Medications     BP 137/93  Pulse 57  Temp(Src) 98.6 F (37 C) (Oral)  Resp 16  Ht 5\' 5"  (1.651 m)  Wt 115 lb (52.164 kg)  BMI 19.14 kg/m2  SpO2 100%  LMP 05/26/2011  Physical Exam  Nursing note and vitals reviewed. Constitutional: She appears well-developed and well-nourished. No distress.       Laying in bed. No acute distress.  HENT:  Head: Normocephalic and atraumatic.  Mouth/Throat: Oropharynx is clear and  moist.  Eyes: Conjunctivae are normal. Right eye exhibits no discharge. Left eye exhibits no discharge.  Neck: Neck supple.  Cardiovascular: Normal rate, regular rhythm and normal heart sounds.  Exam reveals no gallop and no friction rub.   No murmur heard. Pulmonary/Chest: Effort normal and breath sounds normal. No respiratory distress.  Abdominal: Soft. She exhibits no distension. There is no tenderness.       Abdomen normal in appearance. There is no distention. No surgical scars noted. There is no tenderness. No mass palpated.  Genitourinary:       No costovertebral angle tenderness  Musculoskeletal: She exhibits no edema and no tenderness.  Neurological: She is alert.  Skin: Skin is warm and dry.  Psychiatric: She has a normal mood and affect. Her behavior is normal. Thought content normal.    ED Course  Procedures (including critical care time)  Labs Reviewed  URINALYSIS, ROUTINE W REFLEX MICROSCOPIC - Abnormal; Notable for the following:    Color, Urine AMBER (*) BIOCHEMICALS MAY BE AFFECTED BY COLOR   APPearance HAZY (*)    Bilirubin Urine MODERATE (*)    Ketones, ur 15 (*)    Protein, ur 30 (*)    Leukocytes, UA SMALL (*)    All other components within normal limits  URINE MICROSCOPIC-ADD ON - Abnormal; Notable for the following:    Squamous Epithelial / LPF FEW (*)    Bacteria, UA  FEW (*)    All other components within normal limits  PREGNANCY, URINE  LAB REPORT - SCANNED   No results found.   1. Nausea and vomiting       MDM  21 year old female nausea and vomiting. Abdominal exam is benign. Very low clinical suspicion for emergent surgical abdominal or pelvic process. Patient is afebrile well appearing. Reports improvement of symptoms after fluids and Zofran. Return precautions for discussed. Outpatient followup as needed.      Raeford Razor, MD 08/26/11 [phone]

## 2011-08-28 ENCOUNTER — Emergency Department (HOSPITAL_COMMUNITY)
Admission: EM | Admit: 2011-08-28 | Discharge: 2011-08-28 | Disposition: A | Discharge status: Home / Self Care | Payer: Medicaid Other | Attending: Emergency Medicine | Admitting: Emergency Medicine

## 2011-08-28 ENCOUNTER — Encounter (HOSPITAL_COMMUNITY): Payer: Self-pay

## 2011-08-28 DIAGNOSIS — F909 Attention-deficit hyperactivity disorder, unspecified type: Secondary | ICD-10-CM | POA: Insufficient documentation

## 2011-08-28 DIAGNOSIS — E860 Dehydration: Secondary | ICD-10-CM | POA: Insufficient documentation

## 2011-08-28 DIAGNOSIS — F17200 Nicotine dependence, unspecified, uncomplicated: Secondary | ICD-10-CM | POA: Insufficient documentation

## 2011-08-28 DIAGNOSIS — R112 Nausea with vomiting, unspecified: Secondary | ICD-10-CM | POA: Insufficient documentation

## 2011-08-28 LAB — URINALYSIS, ROUTINE W REFLEX MICROSCOPIC
Glucose, UA: NEGATIVE mg/dL
Hgb urine dipstick: NEGATIVE
Ketones, ur: 40 mg/dL — AB
Urobilinogen, UA: 1 mg/dL (ref 0.0–1.0)

## 2011-08-28 LAB — DIFFERENTIAL
Basophils Absolute: 0 10*3/uL (ref 0.0–0.1)
Eosinophils Absolute: 0 10*3/uL (ref 0.0–0.7)
Eosinophils Relative: 0 % (ref 0–5)
Lymphocytes Relative: 19 % (ref 12–46)
Monocytes Absolute: 1.2 10*3/uL — ABNORMAL HIGH (ref 0.1–1.0)

## 2011-08-28 LAB — CBC
HCT: 41.7 % (ref 36.0–46.0)
MCH: 28.3 pg (ref 26.0–34.0)
MCHC: 34.3 g/dL (ref 30.0–36.0)
MCV: 82.4 fL (ref 78.0–100.0)
Platelets: 180 10*3/uL (ref 150–400)
RDW: 12.5 % (ref 11.5–15.5)

## 2011-08-28 LAB — URINE MICROSCOPIC-ADD ON

## 2011-08-28 LAB — BASIC METABOLIC PANEL
CO2: 27 mEq/L (ref 19–32)
Calcium: 9.8 mg/dL (ref 8.4–10.5)
Creatinine, Ser: 0.61 mg/dL (ref 0.50–1.10)
GFR calc non Af Amer: >90 mL/min (ref >90)
Sodium: 135 mEq/L (ref 135–145)

## 2011-08-28 MED ORDER — SODIUM CHLORIDE 0.9 % IV SOLN: Freq: Once | INTRAVENOUS | Status: DC

## 2011-08-28 NOTE — Discharge Instructions (Signed)
Dehydration, Adult Dehydration means your body does not have as much fluid as it needs. Your kidneys, brain, and heart will not work properly without the right amount of fluids and salt.  HOME CARE  Ask your doctor how to replace body fluid losses (rehydrate).   Drink enough fluids to keep your pee (urine) clear or pale yellow.   Drink small amounts of fluids often if you feel sick to your stomach (nauseous) or throw up (vomit).   Eat like you normally do.   Avoid:   Foods or drinks high in sugar.   Bubbly (carbonated) drinks.   Juice.   Very hot or cold fluids.   Drinks with caffeine.   Fatty, greasy foods.   Alcohol.   Tobacco.   Eating too much.   Gelatin desserts.   Wash your hands to avoid spreading germs (bacteria, viruses).   Only take medicine as told by your doctor.   Keep all doctor visits as told.  GET HELP RIGHT AWAY IF:   You cannot drink something without throwing up.   You get worse even with treatment.   Your vomit has blood in it or looks greenish.   Your poop (stool) has blood in it or looks black and tarry.   You have not peed in 6 to 8 hours.   You pee a small amount of very dark pee.   You have a fever.   You pass out (faint).   You have belly (abdominal) pain that gets worse or stays in one spot (localizes).   You have a rash, stiff neck, or bad headache.   You get easily annoyed, sleepy, or are hard to wake up.   You feel weak, dizzy, or very thirsty.  MAKE SURE YOU:   Understand these instructions.   Will watch your condition.   Will get help right away if you are not doing well or get worse.  Document Released: 04/04/2009 Document Revised: 05/28/2011 Document Reviewed: 01/26/2011 Exit[employer] Patient Information 2012 [employer]. 

## 2011-08-28 NOTE — ED Notes (Signed)
Pt presents with vomiting and dehydration since Thursday 08/20/2011. Pt states she has been here and to PMD but is not improving with medications.

## 2011-08-28 NOTE — ED Notes (Signed)
States she feels much better after fluids.  Tolerating PO fluids without complaint.

## 2011-08-28 NOTE — ED Provider Notes (Addendum)
This chart was scribed for [name], MD by Williemae Natter. The patient was seen in room [facility] at 6:57 PM.  CSN: [id number]  Arrival date & time 08/28/11  1512   First MD Initiated Contact with Patient 08/28/11 1855      Chief Complaint  Patient presents with  . Emesis  . Dehydration    (Consider location/radiation/quality/duration/timing/severity/associated sxs/prior treatment) Patient is a 21 y.o. female presenting with vomiting. The history is provided by the patient.  Emesis  This is a new problem. The current episode started more than 1 week ago. The problem has been resolved. The emesis has an appearance of stomach contents. There has been no fever. Pertinent negatives include no diarrhea and no fever.   Karina Palmer is a 21 y.o. female who presents to the Emergency Department complaining of dehydration and vomiting. Pt saw PCP yesterday who gave her a shot and a course of antibiotics for a diagnosed UTI. Pt feels nauseated and cannot keep food down. Pt has not thrown up in 24 hrs and has not eaten today. Pt denies any dysuria, fever, or pain. Pt has no other health problems.  Past Medical History  Diagnosis Date  . ADHD (attention deficit hyperactivity disorder)     History reviewed. No pertinent past surgical history.  No family history on file.  History  Substance Use Topics  . Smoking status: Current Everyday Smoker -- 0.5 packs/day for 5 years    Types: Cigarettes  . Smokeless tobacco: Never Used  . Alcohol Use: Yes     occasional drinker 4-6 beers a week    OB History    Grav Para Term Preterm Abortions TAB SAB Ect Mult Living                  Review of Systems  Constitutional: Negative for fever.  Gastrointestinal: Positive for vomiting. Negative for diarrhea.  10 Systems reviewed and are negative for acute change except as noted in the HPI.   Allergies  Review of patient's allergies indicates no known allergies.  Home Medications      BP 125/94  Pulse 125  Temp(Src) 98.5 F (36.9 C) (Oral)  Resp 18  Ht 5\' 5"  (1.651 m)  Wt 100 lb (45.36 kg)  BMI 16.64 kg/m2  SpO2 100%  LMP 05/26/2011  Physical Exam  Nursing note and vitals reviewed. Constitutional: She is oriented to person, place, and time. She appears well-developed and well-nourished.  HENT:  Head: Normocephalic and atraumatic.  Mouth/Throat: Mucous membranes are dry.       Dry mucous membranes  Eyes: EOM are normal. Pupils are equal, round, and reactive to light.  Neck: Normal range of motion. Neck supple.  Cardiovascular: Normal rate and normal heart sounds.   Pulmonary/Chest: Effort normal and breath sounds normal.  Neurological: She is alert and oriented to person, place, and time.  Skin: Skin is warm and dry.  Psychiatric: She has a normal mood and affect. Her behavior is normal.    ED Course  Procedures (including critical care time) DIAGNOSTIC STUDIES: Oxygen Saturation is 100% on room air, normal by my interpretation.   10:45 PM patient feels much improved after intravenous hydration and oral hydration COORDINATION OF CARE:  Medications  0.9 %  sodium chloride infusion (not administered)    Results for orders placed during the hospital encounter of 08/28/11  CBC      Component Value Range   WBC 5.4  4.0 - 10.5 (K/uL)   RBC  5.06  3.87 - 5.11 (MIL/uL)   Hemoglobin 14.3  12.0 - 15.0 (g/dL)   HCT 54.0  98.1 - 19.1 (%)   MCV 82.4  78.0 - 100.0 (fL)   MCH 28.3  26.0 - 34.0 (pg)   MCHC 34.3  30.0 - 36.0 (g/dL)   RDW 47.8  29.5 - 62.1 (%)   Platelets 180  150 - 400 (K/uL)  DIFFERENTIAL      Component Value Range   Neutrophils Relative 60  43 - 77 (%)   Neutro Abs 3.3  1.7 - 7.7 (K/uL)   Lymphocytes Relative 19  12 - 46 (%)   Lymphs Abs 1.0  0.7 - 4.0 (K/uL)   Monocytes Relative 21 (*) 3 - 12 (%)   Monocytes Absolute 1.2 (*) 0.1 - 1.0 (K/uL)   Eosinophils Relative 0  0 - 5 (%)   Eosinophils Absolute 0.0  0.0 - 0.7 (K/uL)    Basophils Relative 0  0 - 1 (%)   Basophils Absolute 0.0  0.0 - 0.1 (K/uL)  BASIC METABOLIC PANEL      Component Value Range   Sodium 135  135 - 145 (mEq/L)   Potassium 4.2  3.5 - 5.1 (mEq/L)   Chloride 96  96 - 112 (mEq/L)   CO2 27  19 - 32 (mEq/L)   Glucose, Bld 90  70 - 99 (mg/dL)   BUN 15  6 - 23 (mg/dL)   Creatinine, Ser 3.08  0.50 - 1.10 (mg/dL)   Calcium 9.8  8.4 - 65.7 (mg/dL)   GFR calc non Af Amer >90  >90 (mL/min)   GFR calc Af Amer >90  >90 (mL/min)  URINALYSIS, ROUTINE W REFLEX MICROSCOPIC      Component Value Range   Color, Urine AMBER (*) YELLOW    APPearance CLEAR  CLEAR    Specific Gravity, Urine >1.030 (*) 1.005 - 1.030    pH 6.0  5.0 - 8.0    Glucose, UA NEGATIVE  NEGATIVE (mg/dL)   Hgb urine dipstick NEGATIVE  NEGATIVE    Bilirubin Urine SMALL (*) NEGATIVE    Ketones, ur 40 (*) NEGATIVE (mg/dL)   Protein, ur TRACE (*) NEGATIVE (mg/dL)   Urobilinogen, UA 1.0  0.0 - 1.0 (mg/dL)   Nitrite NEGATIVE  NEGATIVE    Leukocytes, UA TRACE (*) NEGATIVE   PREGNANCY, URINE      Component Value Range   Preg Test, Ur NEGATIVE  NEGATIVE   URINE MICROSCOPIC-ADD ON      Component Value Range   Squamous Epithelial / LPF FEW (*) RARE    WBC, UA 21-50  <3 (WBC/hpf)   RBC / HPF 3-6  <3 (RBC/hpf)   Bacteria, UA FEW (*) RARE    No results found.   Labs Reviewed  CBC  DIFFERENTIAL  BASIC METABOLIC PANEL   No results found.   No diagnosis found.    MDM  Plan urine sent for culture Encourage by mouth hydration Followup with Dr.Halm if not feeling better by next week Diagnosis #1 dehydration #2 UTI I personally performed the services described in this documentation, which was scribed in my presence. The recorded information has been reviewed and considered.         [name], MD 08/28/11 8469  [name], MD 08/28/11 2302

## 2011-08-30 LAB — URINE CULTURE
Colony Count: NO GROWTH
Culture  Setup Time: 201303092057

## 2011-09-09 ENCOUNTER — Encounter (HOSPITAL_COMMUNITY): Payer: Self-pay

## 2011-09-09 ENCOUNTER — Emergency Department (HOSPITAL_COMMUNITY)
Admission: EM | Admit: 2011-09-09 | Discharge: 2011-09-09 | Disposition: A | Discharge status: Home / Self Care | Payer: Medicaid Other | Attending: Emergency Medicine | Admitting: Emergency Medicine

## 2011-09-09 ENCOUNTER — Emergency Department (HOSPITAL_COMMUNITY): Payer: Medicaid Other

## 2011-09-09 DIAGNOSIS — R070 Pain in throat: Secondary | ICD-10-CM | POA: Insufficient documentation

## 2011-09-09 DIAGNOSIS — F909 Attention-deficit hyperactivity disorder, unspecified type: Secondary | ICD-10-CM | POA: Insufficient documentation

## 2011-09-09 DIAGNOSIS — B349 Viral infection, unspecified: Secondary | ICD-10-CM

## 2011-09-09 DIAGNOSIS — IMO0001 Reserved for inherently not codable concepts without codable children: Secondary | ICD-10-CM | POA: Insufficient documentation

## 2011-09-09 DIAGNOSIS — R51 Headache: Secondary | ICD-10-CM | POA: Insufficient documentation

## 2011-09-09 DIAGNOSIS — F17200 Nicotine dependence, unspecified, uncomplicated: Secondary | ICD-10-CM | POA: Insufficient documentation

## 2011-09-09 DIAGNOSIS — R509 Fever, unspecified: Secondary | ICD-10-CM | POA: Insufficient documentation

## 2011-09-09 DIAGNOSIS — R61 Generalized hyperhidrosis: Secondary | ICD-10-CM | POA: Insufficient documentation

## 2011-09-09 DIAGNOSIS — R059 Cough, unspecified: Secondary | ICD-10-CM | POA: Insufficient documentation

## 2011-09-09 DIAGNOSIS — R05 Cough: Secondary | ICD-10-CM | POA: Insufficient documentation

## 2011-09-09 LAB — URINALYSIS, ROUTINE W REFLEX MICROSCOPIC
Glucose, UA: NEGATIVE mg/dL
Hgb urine dipstick: NEGATIVE
Specific Gravity, Urine: 1.03 (ref 1.005–1.030)
pH: 6 (ref 5.0–8.0)

## 2011-09-09 LAB — PREGNANCY, URINE: Preg Test, Ur: NEGATIVE

## 2011-09-09 LAB — URINE MICROSCOPIC-ADD ON

## 2011-09-09 MED ORDER — SODIUM CHLORIDE 0.9 % IV BOLUS (SEPSIS)
1000.0000 mL | Freq: Once | INTRAVENOUS | Status: AC
  Administered 2011-09-09: 1000 mL via INTRAVENOUS

## 2011-09-09 MED ORDER — ONDANSETRON HCL 4 MG/2ML IJ SOLN
4.0000 mg | Freq: Once | INTRAMUSCULAR | Status: AC
  Administered 2011-09-09: 4 mg via INTRAVENOUS
  Filled 2011-09-09: qty 2

## 2011-09-09 MED ORDER — HYDROMORPHONE HCL PF 1 MG/ML IJ SOLN
1.0000 mg | Freq: Once | INTRAMUSCULAR | Status: AC
  Administered 2011-09-09: 1 mg via INTRAVENOUS
  Filled 2011-09-09: qty 1

## 2011-09-09 MED ORDER — KETOROLAC TROMETHAMINE 30 MG/ML IJ SOLN
30.0000 mg | Freq: Once | INTRAMUSCULAR | Status: AC
  Administered 2011-09-09: 30 mg via INTRAVENOUS
  Filled 2011-09-09: qty 1

## 2011-09-09 NOTE — ED Provider Notes (Signed)
History   This chart was scribed for [employer]. Colon Branch, MD by Brooks Sailors. The patient was seen in room . Patient's care was started at 0605.   CSN: [id number]  Arrival date & time 09/09/11  0605   None     Chief Complaint  Patient presents with  . Fever    (Consider location/radiation/quality/duration/timing/severity/associated sxs/prior treatment) HPI MELISS FLEEK is a 21 y.o. female who presents to the Emergency Department complaining of constant moderate fever onset two days ago with associated HA, myalgia, cough, night sweats and sore throat. Patient has not attempted any at home treatment of symptoms. Denies nausea, vomiting, swelling of lower extremities and recent sick contacts. H/o ADHD.  Dr. Milford Cage PCP   Past Medical History  Diagnosis Date  . ADHD (attention deficit hyperactivity disorder)     History reviewed. No pertinent past surgical history.  No family history on file.  History  Substance Use Topics  . Smoking status: Current Everyday Smoker -- 0.5 packs/day for 5 years    Types: Cigarettes  . Smokeless tobacco: Never Used  . Alcohol Use: No     occasional drinker 4-6 beers a week    OB History    Grav Para Term Preterm Abortions TAB SAB Ect Mult Living                  Review of Systems 10 Systems reviewed and are negative for acute change except as noted in the HPI.  Allergies  Review of patient's allergies indicates no known allergies.  Home Medications     BP 124/74  Pulse 104  Temp(Src) 100.4 F (38 C) (Oral)  Resp 16  Ht 5\' 6"  (1.676 m)  Wt 100 lb (45.36 kg)  BMI 16.14 kg/m2  SpO2 98%  LMP 05/26/2011  Physical Exam  Nursing note and vitals reviewed. Constitutional: She is oriented to person, place, and time. She appears well-developed and well-nourished. No distress.  HENT:  Head: Normocephalic and atraumatic.  Right Ear: Tympanic membrane and external ear normal.  Left Ear: Tympanic membrane and external ear normal.    Mouth/Throat: Oropharynx is clear and moist.  Eyes: EOM are normal. Pupils are equal, round, and reactive to light.  Neck: Neck supple. No tracheal deviation present.  Cardiovascular: Normal rate, regular rhythm and normal heart sounds.  Exam reveals no gallop and no friction rub.   No murmur heard. Pulmonary/Chest: Effort normal. No respiratory distress. She has no wheezes. She has no rales.  Abdominal: Soft. Bowel sounds are normal. She exhibits no distension. There is no tenderness.  Musculoskeletal: Normal range of motion. She exhibits no edema.  Neurological: She is alert and oriented to person, place, and time. No sensory deficit.  Skin: Skin is warm and dry.  Psychiatric: She has a normal mood and affect. Her behavior is normal.    ED Course  Procedures (including critical care time) DIAGNOSTIC STUDIES: Oxygen Saturation is 98% on room air, normal by my interpretation.    COORDINATION OF CARE: 7:00AM-Patient informed of current plan for treatment and evaluation and agrees with plan at this time.  9:14AM Patient noted feeling better after administration of Toradol, Dilaudid, Zofran.     Labs Reviewed  URINALYSIS, ROUTINE W REFLEX MICROSCOPIC   Dg Chest 2 View  09/09/2011  *RADIOLOGY REPORT*  Clinical Data: Productive cough  CHEST - 2 VIEW  Comparison: None  Findings: Normal mediastinum and cardiac silhouette.  Normal pulmonary  vasculature.  No evidence of effusion, infiltrate,  or pneumothorax.  No acute bony abnormality.  IMPRESSION: No acute cardiopulmonary process.  Original Report Authenticated By: Genevive Bi, M.D.     No diagnosis found.    MDM  Patient with flulike symptoms 2 days with headache, sore throat, body aches, cough productive of yellow-green sputum. Chest x-ray is clear. Given IV fluids, anti-emetics, analgesic, an anti-inflammatory with improvement. Patient able to take by mouth fluids. Pt feels improved after observation and/or treatment in ED.Pt  stable in ED with no significant deterioration in condition.The patient appears reasonably screened and/or stabilized for discharge and I doubt any other medical condition or other [facility] requiring further screening, evaluation, or treatment in the ED at this time prior to discharge.  I personally performed the services described in this documentation, which was scribed in my presence. The recorded information has been reviewed and considered.   MDM Reviewed: nursing note and vitals Interpretation: x-ray          Nicoletta Dress. Colon Branch, MD 09/09/11 [phone]

## 2011-09-09 NOTE — Discharge Instructions (Signed)
Drink lots of fluids. Use Tylenol alternating with ibuprofen for the aches, pains, any fevers.

## 2011-09-09 NOTE — ED Notes (Signed)
Pt states sore throat, fever & ha x2 days. Pt advises he has not taken anything for his symptoms.

## 2012-05-26 ENCOUNTER — Other Ambulatory Visit: Payer: Self-pay | Admitting: Adult Health

## 2012-05-26 ENCOUNTER — Other Ambulatory Visit (HOSPITAL_COMMUNITY)
Admission: RE | Admit: 2012-05-26 | Discharge: 2012-05-26 | Disposition: A | Discharge status: Home / Self Care | Payer: Medicaid Other | Source: Ambulatory Visit | Attending: Obstetrics and Gynecology | Admitting: Obstetrics and Gynecology

## 2012-05-26 DIAGNOSIS — Z113 Encounter for screening for infections with a predominantly sexual mode of transmission: Secondary | ICD-10-CM | POA: Insufficient documentation

## 2012-05-26 DIAGNOSIS — Z01419 Encounter for gynecological examination (general) (routine) without abnormal findings: Secondary | ICD-10-CM | POA: Insufficient documentation

## 2013-07-06 ENCOUNTER — Ambulatory Visit (INDEPENDENT_AMBULATORY_CARE_PROVIDER_SITE_OTHER): Payer: Medicaid Other | Admitting: Adult Health

## 2013-07-06 ENCOUNTER — Encounter (INDEPENDENT_AMBULATORY_CARE_PROVIDER_SITE_OTHER): Payer: Self-pay

## 2013-07-06 ENCOUNTER — Other Ambulatory Visit (HOSPITAL_COMMUNITY)
Admission: RE | Admit: 2013-07-06 | Discharge: 2013-07-06 | Disposition: A | Discharge status: Home / Self Care | Payer: Medicaid Other | Source: Ambulatory Visit | Attending: Adult Health | Admitting: Adult Health

## 2013-07-06 ENCOUNTER — Encounter: Payer: Self-pay | Admitting: Adult Health

## 2013-07-06 VITALS — BP 122/84 | HR 72 | Ht 64.5 in | Wt 112.0 lb

## 2013-07-06 DIAGNOSIS — Z113 Encounter for screening for infections with a predominantly sexual mode of transmission: Secondary | ICD-10-CM | POA: Insufficient documentation

## 2013-07-06 DIAGNOSIS — Z01419 Encounter for gynecological examination (general) (routine) without abnormal findings: Secondary | ICD-10-CM | POA: Insufficient documentation

## 2013-07-06 DIAGNOSIS — Z7689 Persons encountering health services in other specified circumstances: Secondary | ICD-10-CM

## 2013-07-06 DIAGNOSIS — Z0000 Encounter for general adult medical examination without abnormal findings: Secondary | ICD-10-CM

## 2013-07-06 HISTORY — DX: Persons encountering health services in other specified circumstances: Z76.89

## 2013-07-06 MED ORDER — NORETHIN ACE-ETH ESTRAD-FE 1-20 MG-MCG PO TABS: 1.0000 | ORAL_TABLET | Freq: Every day | ORAL | Status: DC

## 2013-07-06 NOTE — Progress Notes (Signed)
Patient ID: Karina DineDenise M Palmer, female   DOB: 1991/04/06, 23 y.o.   MRN: [id number] History of Present Illness: Karina Palmer is a 23 year old black female, single in for a pap and physical and get pills refilled, she is on lo loestrin.she says she feels better on the pill, not sick like before.   Current Medications, Allergies, Past Medical History, Past Surgical History, Family History and Social History were reviewed in [name].   Past Medical History  Diagnosis Date  . ADHD (attention deficit hyperactivity disorder)   . Insomnia   . Menstrual extraction 07/06/2013  History reviewed. No pertinent past surgical history. Current outpatient prescriptions:albuterol (VENTOLIN HFA) 108 (90 BASE) MCG/ACT inhaler, Inhale 2 puffs into the lungs every 6 (six) hours as needed. FOR SYMPTOMS, Disp: , Rfl: ;  methylphenidate (CONCERTA) 18 MG CR tablet, Take 18 mg by mouth daily., Disp: , Rfl: ;  norethindrone-ethinyl estradiol (JUNEL FE,GILDESS FE,LOESTRIN FE) 1-20 MG-MCG tablet, Take 1 tablet by mouth daily., Disp: 1 Package, Rfl: 11 Norethindrone-Ethinyl Estradiol-Fe Biphas (LO LOESTRIN FE) 1 MG-10 MCG / 10 MCG tablet, Take 1 tablet by mouth daily., Disp: , Rfl: ;  Promethazine HCl (PHENERGAN RE), Place 1 suppository rectally as needed. FOR NAUSEA, Disp: , Rfl: ;  zolpidem (AMBIEN) 10 MG tablet, Take 10 mg by mouth at bedtime.  , Disp: , Rfl:   Review of Systems: Patient denies any headaches, blurred vision, shortness of breath, chest pain, abdominal pain, problems with bowel movements, urination, or intercourse(has sex with women). No joint pain or mood changes, has ADD.   Physical Exam:BP 122/84  Pulse 72  Ht 5' 4.5" (1.638 m)  Wt 112 lb (50.803 kg)  BMI 18.93 kg/m2 General:  Well developed, well nourished, no acute distress Skin:  Warm and dry, has tattoos Neck:  Midline trachea, normal thyroid Lungs; Clear to auscultation bilaterally Breast:  No dominant palpable mass,  retraction, or nipple discharge Cardiovascular: Regular rate and rhythm Abdomen:  Soft, non tender, no hepatosplenomegaly Pelvic:  External genitalia is normal in appearance.  The vagina is normal in appearance.The cervix is pink and nulliparous, pap with GC/CHL performed. Uterus is felt to be normal size, shape, and contour.  No adnexal masses or tenderness noted. Extremities:  No swelling or varicosities noted Psych:  No mood changes, alert and cooperative, seems happy   Impression: Yearly gyn exam Period management    Plan: Physical in 1 year Refilled lo loestrin x 1 year Call prn problems

## 2013-07-06 NOTE — Patient Instructions (Signed)
Physical in  1 year Continue lo loestrin  

## 2013-07-07 ENCOUNTER — Other Ambulatory Visit: Payer: Self-pay | Admitting: Adult Health

## 2013-07-07 MED ORDER — NORETHIN-ETH ESTRAD-FE BIPHAS 1 MG-10 MCG / 10 MCG PO TABS: 1.0000 | ORAL_TABLET | Freq: Every day | ORAL | Status: DC

## 2013-07-10 MED ORDER — HEPARIN (PORCINE) IN NACL 2-0.9 UNIT/ML-% IJ SOLN
INTRAMUSCULAR | Status: AC
  Filled 2013-07-10: qty 500

## 2013-07-10 MED ORDER — MIDAZOLAM HCL 2 MG/2ML IJ SOLN
INTRAMUSCULAR | Status: AC
  Filled 2013-07-10: qty 2

## 2013-07-10 MED ORDER — LIDOCAINE HCL (PF) 1 % IJ SOLN
INTRAMUSCULAR | Status: AC
  Filled 2013-07-10: qty 30

## 2013-07-10 MED ORDER — FENTANYL CITRATE 0.05 MG/ML IJ SOLN
INTRAMUSCULAR | Status: AC
  Filled 2013-07-10: qty 2

## 2013-09-18 ENCOUNTER — Telehealth: Payer: Self-pay

## 2013-09-18 MED ORDER — NORETHIN-ETH ESTRAD-FE BIPHAS 1 MG-10 MCG / 10 MCG PO TABS: 1.0000 | ORAL_TABLET | Freq: Every day | ORAL | Status: DC

## 2013-09-18 NOTE — Telephone Encounter (Signed)
Pt requesting samples of Lo Loestrin FE, 1 box of samples left at front desk, Lot #[id number], EXP. 07/2014

## 2014-03-19 ENCOUNTER — Emergency Department (HOSPITAL_COMMUNITY)
Admission: EM | Admit: 2014-03-19 | Discharge: 2014-03-19 | Disposition: A | Discharge status: Home / Self Care | Payer: Medicaid Other | Attending: Emergency Medicine | Admitting: Emergency Medicine

## 2014-03-19 ENCOUNTER — Encounter (HOSPITAL_COMMUNITY): Payer: Self-pay | Admitting: Emergency Medicine

## 2014-03-19 DIAGNOSIS — Z792 Long term (current) use of antibiotics: Secondary | ICD-10-CM | POA: Insufficient documentation

## 2014-03-19 DIAGNOSIS — Z79899 Other long term (current) drug therapy: Secondary | ICD-10-CM | POA: Insufficient documentation

## 2014-03-19 DIAGNOSIS — R112 Nausea with vomiting, unspecified: Secondary | ICD-10-CM

## 2014-03-19 DIAGNOSIS — Z8659 Personal history of other mental and behavioral disorders: Secondary | ICD-10-CM | POA: Diagnosis not present

## 2014-03-19 DIAGNOSIS — F17200 Nicotine dependence, unspecified, uncomplicated: Secondary | ICD-10-CM | POA: Diagnosis not present

## 2014-03-19 DIAGNOSIS — N390 Urinary tract infection, site not specified: Secondary | ICD-10-CM | POA: Insufficient documentation

## 2014-03-19 DIAGNOSIS — R197 Diarrhea, unspecified: Secondary | ICD-10-CM | POA: Diagnosis not present

## 2014-03-19 DIAGNOSIS — Z3202 Encounter for pregnancy test, result negative: Secondary | ICD-10-CM | POA: Diagnosis not present

## 2014-03-19 LAB — URINALYSIS, ROUTINE W REFLEX MICROSCOPIC
GLUCOSE, UA: NEGATIVE mg/dL
HGB URINE DIPSTICK: NEGATIVE
Ketones, ur: 15 mg/dL — AB
Leukocytes, UA: NEGATIVE
Nitrite: POSITIVE — AB
Protein, ur: 100 mg/dL — AB
SPECIFIC GRAVITY, URINE: 1.02 (ref 1.005–1.030)
pH: 6.5 (ref 5.0–8.0)

## 2014-03-19 LAB — BASIC METABOLIC PANEL
Anion gap: 15 (ref 5–15)
BUN: 14 mg/dL (ref 6–23)
CHLORIDE: 98 meq/L (ref 96–112)
CO2: 26 mEq/L (ref 19–32)
CREATININE: 0.57 mg/dL (ref 0.50–1.10)
Calcium: 9.6 mg/dL (ref 8.4–10.5)
GFR calc non Af Amer: >90 mL/min (ref >90)
Glucose, Bld: 93 mg/dL (ref 70–99)
Potassium: 3.2 mEq/L — ABNORMAL LOW (ref 3.7–5.3)
Sodium: 139 mEq/L (ref 137–147)

## 2014-03-19 LAB — URINE MICROSCOPIC-ADD ON

## 2014-03-19 LAB — CBC WITH DIFFERENTIAL/PLATELET
BASOS PCT: 0 % (ref 0–1)
Basophils Absolute: 0 10*3/uL (ref 0.0–0.1)
EOS ABS: 0 10*3/uL (ref 0.0–0.7)
Eosinophils Relative: 0 % (ref 0–5)
HEMATOCRIT: 42.9 % (ref 36.0–46.0)
HEMOGLOBIN: 14.5 g/dL (ref 12.0–15.0)
Lymphocytes Relative: 22 % (ref 12–46)
Lymphs Abs: 1.5 10*3/uL (ref 0.7–4.0)
MCH: 28.8 pg (ref 26.0–34.0)
MCHC: 33.8 g/dL (ref 30.0–36.0)
MCV: 85.1 fL (ref 78.0–100.0)
MONO ABS: 0.8 10*3/uL (ref 0.1–1.0)
MONOS PCT: 11 % (ref 3–12)
Neutro Abs: 4.6 10*3/uL (ref 1.7–7.7)
Neutrophils Relative %: 67 % (ref 43–77)
Platelets: 197 10*3/uL (ref 150–400)
RBC: 5.04 MIL/uL (ref 3.87–5.11)
RDW: 13.2 % (ref 11.5–15.5)
WBC: 6.9 10*3/uL (ref 4.0–10.5)

## 2014-03-19 LAB — PREGNANCY, URINE: Preg Test, Ur: NEGATIVE

## 2014-03-19 MED ORDER — PROMETHAZINE HCL 12.5 MG PO TABS: 12.5000 mg | ORAL_TABLET | Freq: Four times a day (QID) | ORAL | Status: DC | PRN

## 2014-03-19 MED ORDER — DEXTROSE 5 % IV SOLN
1.0000 g | Freq: Once | INTRAVENOUS | Status: AC
  Administered 2014-03-19: 1 g via INTRAVENOUS
  Filled 2014-03-19: qty 10

## 2014-03-19 MED ORDER — SODIUM CHLORIDE 0.9 % IV BOLUS (SEPSIS)
1000.0000 mL | Freq: Once | INTRAVENOUS | Status: AC
  Administered 2014-03-19: 1000 mL via INTRAVENOUS

## 2014-03-19 MED ORDER — ONDANSETRON HCL 4 MG/2ML IJ SOLN
4.0000 mg | Freq: Once | INTRAMUSCULAR | Status: AC
  Administered 2014-03-19: 4 mg via INTRAVENOUS
  Filled 2014-03-19: qty 2

## 2014-03-19 MED ORDER — CEPHALEXIN 500 MG PO CAPS: 500.0000 mg | ORAL_CAPSULE | Freq: Four times a day (QID) | ORAL | Status: DC

## 2014-03-19 MED ORDER — SODIUM CHLORIDE 0.9 % IV SOLN
INTRAVENOUS | Status: DC
  Administered 2014-03-19: 16:00:00 via INTRAVENOUS

## 2014-03-19 MED ORDER — ONDANSETRON 4 MG PO TBDP: 4.0000 mg | ORAL_TABLET | Freq: Three times a day (TID) | ORAL | Status: DC | PRN

## 2014-03-19 NOTE — ED Notes (Signed)
Vomiting x 3 days

## 2014-03-19 NOTE — ED Notes (Addendum)
CRITICAL VALUE ALERT  Critical value received:   Date of notification:    Time of notification:    Critical value read back:   Nurse who received alert:   MD notified (1st page):    Time of first page:    MD notified (2nd page):   Time of second page:   Responding MD:   Time MD responded:  

## 2014-03-19 NOTE — Discharge Instructions (Signed)
Taking anabolic Keflex as directed for the next 7 days. Take the antinausea medicine Phenergan every 6 hours. Can supplement with the Zofran which is double bubble tablet. Return for any persistent vomiting or new or worse symptoms.

## 2014-03-19 NOTE — ED Provider Notes (Signed)
CSN: [id number]     Arrival date & time 03/19/14  1130 History   First MD Initiated Contact with Patient 03/19/14 1310     Chief Complaint  Patient presents with  . Emesis     (Consider location/radiation/quality/duration/timing/severity/associated sxs/prior Treatment) Patient is a 23 y.o. female presenting with vomiting. The history is provided by the patient.  Emesis Associated symptoms: abdominal pain and diarrhea   Associated symptoms: no headaches    patient with 3 day history of nausea and vomiting. On the first day did have some diarrhea but this resolved. Patient's Donald pain is diffuse and not severe. The vomiting has been persistent for the 3 days with several episodes no vomiting blood.  Past Medical History  Diagnosis Date  . ADHD (attention deficit hyperactivity disorder)   . Insomnia   . Menstrual extraction 07/06/2013   History reviewed. No pertinent past surgical history. No family history on file. History  Substance Use Topics  . Smoking status: Current Every Day Smoker -- 0.50 packs/day for 5 years    Types: Cigars  . Smokeless tobacco: Never Used  . Alcohol Use: Yes     Comment: occasional drinker 4-6 beers a week   OB History   Grav Para Term Preterm Abortions TAB SAB Ect Mult Living                 Review of Systems  Constitutional: Negative for fever.  HENT: Negative for congestion.   Eyes: Negative for redness.  Respiratory: Negative for shortness of breath.   Cardiovascular: Negative for chest pain.  Gastrointestinal: Positive for nausea, vomiting, abdominal pain and diarrhea.  Genitourinary: Negative for dysuria.  Musculoskeletal: Negative for back pain.  Skin: Negative for rash.  Neurological: Negative for headaches.  Hematological: Does not bruise/bleed easily.  Psychiatric/Behavioral: Negative for confusion.      Allergies  Review of patient's allergies indicates no known allergies.  Home Medications   Prior to Admission  medications   Medication Sig Start Date End Date Taking? Authorizing Provider  albuterol (VENTOLIN HFA) 108 (90 BASE) MCG/ACT inhaler Inhale 2 puffs into the lungs every 6 (six) hours as needed. FOR SYMPTOMS   Yes Historical Provider, MD  cephALEXin (KEFLEX) 500 MG capsule Take 1 capsule (500 mg total) by mouth 4 (four) times daily. 03/19/14   Vanetta Mulders, MD  ondansetron (ZOFRAN ODT) 4 MG disintegrating tablet Take 1 tablet (4 mg total) by mouth every 8 (eight) hours as needed. 03/19/14   Vanetta Mulders, MD  promethazine (PHENERGAN) 12.5 MG tablet Take 1 tablet (12.5 mg total) by mouth every 6 (six) hours as needed for nausea or vomiting. 03/19/14   Vanetta Mulders, MD  promethazine (PHENERGAN) 25 MG suppository Place 1 suppository (25 mg total) rectally every 6 (six) hours as needed for nausea. 03/09/11 03/16/11  Burgess Amor, PA-C   BP 112/71  Pulse 59  Temp(Src) 98.2 F (36.8 C) (Oral)  Resp 16  SpO2 100% Physical Exam  Nursing note and vitals reviewed. Constitutional: She is oriented to person, place, and time. She appears well-developed and well-nourished. No distress.  HENT:  Head: Normocephalic and atraumatic.  Mucous membranes slightly dry.  Eyes: Conjunctivae and EOM are normal. Pupils are equal, round, and reactive to light.  Neck: Normal range of motion.  Cardiovascular: Normal rate, regular rhythm and normal heart sounds.   Pulmonary/Chest: Effort normal and breath sounds normal. No respiratory distress.  Abdominal: Soft. Bowel sounds are normal. There is no tenderness.  Musculoskeletal: Normal range  of motion. She exhibits no edema.  Neurological: She is alert and oriented to person, place, and time. No cranial nerve deficit. She exhibits normal muscle tone. Coordination normal.  Skin: Skin is warm. No rash noted.    ED Course  Procedures (including critical care time) Labs Review Labs Reviewed  BASIC METABOLIC PANEL - Abnormal; Notable for the following:    Potassium  3.2 (*)    All other components within normal limits  URINALYSIS, ROUTINE W REFLEX MICROSCOPIC - Abnormal; Notable for the following:    Color, Urine ORANGE (*)    Bilirubin Urine MODERATE (*)    Ketones, ur 15 (*)    Protein, ur 100 (*)    Urobilinogen, UA >8.0 (*)    Nitrite POSITIVE (*)    All other components within normal limits  URINE MICROSCOPIC-ADD ON - Abnormal; Notable for the following:    Squamous Epithelial / LPF FEW (*)    Bacteria, UA FEW (*)    All other components within normal limits  CBC WITH DIFFERENTIAL  PREGNANCY, URINE   Results for orders placed during the hospital encounter of 03/19/14  CBC WITH DIFFERENTIAL      Result Value Ref Range   WBC 6.9  4.0 - 10.5 K/uL   RBC 5.04  3.87 - 5.11 MIL/uL   Hemoglobin 14.5  12.0 - 15.0 g/dL   HCT 30.8  65.7 - 84.6 %   MCV 85.1  78.0 - 100.0 fL   MCH 28.8  26.0 - 34.0 pg   MCHC 33.8  30.0 - 36.0 g/dL   RDW 96.2  95.2 - 84.1 %   Platelets 197  150 - 400 K/uL   Neutrophils Relative % 67  43 - 77 %   Neutro Abs 4.6  1.7 - 7.7 K/uL   Lymphocytes Relative 22  12 - 46 %   Lymphs Abs 1.5  0.7 - 4.0 K/uL   Monocytes Relative 11  3 - 12 %   Monocytes Absolute 0.8  0.1 - 1.0 K/uL   Eosinophils Relative 0  0 - 5 %   Eosinophils Absolute 0.0  0.0 - 0.7 K/uL   Basophils Relative 0  0 - 1 %   Basophils Absolute 0.0  0.0 - 0.1 K/uL  BASIC METABOLIC PANEL      Result Value Ref Range   Sodium 139  137 - 147 mEq/L   Potassium 3.2 (*) 3.7 - 5.3 mEq/L   Chloride 98  96 - 112 mEq/L   CO2 26  19 - 32 mEq/L   Glucose, Bld 93  70 - 99 mg/dL   BUN 14  6 - 23 mg/dL   Creatinine, Ser 3.24  0.50 - 1.10 mg/dL   Calcium 9.6  8.4 - 40.1 mg/dL   GFR calc non Af Amer >90  >90 mL/min   GFR calc Af Amer >90  >90 mL/min   Anion gap 15  5 - 15  URINALYSIS, ROUTINE W REFLEX MICROSCOPIC      Result Value Ref Range   Color, Urine ORANGE (*) YELLOW   APPearance CLEAR  CLEAR   Specific Gravity, Urine 1.020  1.005 - 1.030   pH 6.5  5.0 - 8.0    Glucose, UA NEGATIVE  NEGATIVE mg/dL   Hgb urine dipstick NEGATIVE  NEGATIVE   Bilirubin Urine MODERATE (*) NEGATIVE   Ketones, ur 15 (*) NEGATIVE mg/dL   Protein, ur 027 (*) NEGATIVE mg/dL   Urobilinogen, UA >2.5 (*) 0.0 -  1.0 mg/dL   Nitrite POSITIVE (*) NEGATIVE   Leukocytes, UA NEGATIVE  NEGATIVE  PREGNANCY, URINE      Result Value Ref Range   Preg Test, Ur NEGATIVE  NEGATIVE  URINE MICROSCOPIC-ADD ON      Result Value Ref Range   Squamous Epithelial / LPF FEW (*) RARE   WBC, UA 3-6  <3 WBC/hpf   Bacteria, UA FEW (*) RARE   Urine-Other MUCOUS PRESENT       Imaging Review No results found.   EKG Interpretation None      MDM   Final diagnoses:  Non-intractable vomiting with nausea, vomiting of unspecified type  UTI (lower urinary tract infection)    Clinically patient with urinary tract infection. In the history of nausea and vomiting for the past 3 days. No fevers. Vomiting may be separate from the urinary tract infection. Patient over with did receive 1 g of Rocephin IVPB back call in the emergency department to be continued on Keflex. The urine has been cultured. Patient improved with antinausea medicine and fluids here.  The patient's abdomen nontender flat no evidence of any acute surgical abdomen process. Patient's onset of the illness to include nausea and vomiting and some mild diarrhea on the first day so could be a gastroenteritis. No sick contacts.  Vanetta Mulders, MD 03/19/14 [phone]

## 2014-03-21 ENCOUNTER — Encounter (HOSPITAL_COMMUNITY): Payer: Self-pay | Admitting: Emergency Medicine

## 2014-03-21 ENCOUNTER — Emergency Department (HOSPITAL_COMMUNITY)
Admission: EM | Admit: 2014-03-21 | Discharge: 2014-03-21 | Disposition: A | Discharge status: Home / Self Care | Payer: Medicaid Other | Attending: Emergency Medicine | Admitting: Emergency Medicine

## 2014-03-21 DIAGNOSIS — K5900 Constipation, unspecified: Secondary | ICD-10-CM | POA: Diagnosis not present

## 2014-03-21 DIAGNOSIS — R109 Unspecified abdominal pain: Secondary | ICD-10-CM | POA: Diagnosis present

## 2014-03-21 DIAGNOSIS — Z792 Long term (current) use of antibiotics: Secondary | ICD-10-CM | POA: Insufficient documentation

## 2014-03-21 DIAGNOSIS — Z3202 Encounter for pregnancy test, result negative: Secondary | ICD-10-CM | POA: Diagnosis not present

## 2014-03-21 DIAGNOSIS — Z8659 Personal history of other mental and behavioral disorders: Secondary | ICD-10-CM | POA: Diagnosis not present

## 2014-03-21 DIAGNOSIS — R143 Flatulence: Secondary | ICD-10-CM

## 2014-03-21 DIAGNOSIS — R141 Gas pain: Secondary | ICD-10-CM | POA: Diagnosis not present

## 2014-03-21 DIAGNOSIS — Z8742 Personal history of other diseases of the female genital tract: Secondary | ICD-10-CM | POA: Diagnosis not present

## 2014-03-21 DIAGNOSIS — Z79899 Other long term (current) drug therapy: Secondary | ICD-10-CM | POA: Insufficient documentation

## 2014-03-21 DIAGNOSIS — Z87440 Personal history of urinary (tract) infections: Secondary | ICD-10-CM | POA: Diagnosis not present

## 2014-03-21 DIAGNOSIS — F17200 Nicotine dependence, unspecified, uncomplicated: Secondary | ICD-10-CM | POA: Diagnosis not present

## 2014-03-21 DIAGNOSIS — R142 Eructation: Secondary | ICD-10-CM

## 2014-03-21 DIAGNOSIS — R112 Nausea with vomiting, unspecified: Secondary | ICD-10-CM

## 2014-03-21 LAB — CBC WITH DIFFERENTIAL/PLATELET
Basophils Absolute: 0 10*3/uL (ref 0.0–0.1)
Basophils Relative: 0 % (ref 0–1)
EOS ABS: 0 10*3/uL (ref 0.0–0.7)
Eosinophils Relative: 0 % (ref 0–5)
HEMATOCRIT: 43.2 % (ref 36.0–46.0)
Hemoglobin: 14.6 g/dL (ref 12.0–15.0)
LYMPHS PCT: 20 % (ref 12–46)
Lymphs Abs: 1.1 10*3/uL (ref 0.7–4.0)
MCH: 28.9 pg (ref 26.0–34.0)
MCHC: 33.8 g/dL (ref 30.0–36.0)
MCV: 85.5 fL (ref 78.0–100.0)
MONO ABS: 0.6 10*3/uL (ref 0.1–1.0)
Monocytes Relative: 10 % (ref 3–12)
Neutro Abs: 3.9 10*3/uL (ref 1.7–7.7)
Neutrophils Relative %: 70 % (ref 43–77)
Platelets: 180 10*3/uL (ref 150–400)
RBC: 5.05 MIL/uL (ref 3.87–5.11)
RDW: 12.8 % (ref 11.5–15.5)
WBC: 5.5 10*3/uL (ref 4.0–10.5)

## 2014-03-21 LAB — URINALYSIS, ROUTINE W REFLEX MICROSCOPIC
Glucose, UA: 100 mg/dL — AB
Hgb urine dipstick: NEGATIVE
Ketones, ur: 40 mg/dL — AB
Leukocytes, UA: NEGATIVE
NITRITE: NEGATIVE
PROTEIN: NEGATIVE mg/dL
Specific Gravity, Urine: 1.025 (ref 1.005–1.030)
pH: 6 (ref 5.0–8.0)

## 2014-03-21 LAB — COMPREHENSIVE METABOLIC PANEL
ALT: 29 U/L (ref 0–35)
AST: 23 U/L (ref 0–37)
Albumin: 4.5 g/dL (ref 3.5–5.2)
Alkaline Phosphatase: 64 U/L (ref 39–117)
Anion gap: 12 (ref 5–15)
BUN: 13 mg/dL (ref 6–23)
CALCIUM: 9.7 mg/dL (ref 8.4–10.5)
CO2: 28 meq/L (ref 19–32)
Chloride: 96 mEq/L (ref 96–112)
Creatinine, Ser: 0.65 mg/dL (ref 0.50–1.10)
GLUCOSE: 94 mg/dL (ref 70–99)
Potassium: 3.6 mEq/L — ABNORMAL LOW (ref 3.7–5.3)
Sodium: 136 mEq/L — ABNORMAL LOW (ref 137–147)
Total Bilirubin: 0.7 mg/dL (ref 0.3–1.2)
Total Protein: 8.3 g/dL (ref 6.0–8.3)

## 2014-03-21 LAB — POC URINE PREG, ED: PREG TEST UR: NEGATIVE

## 2014-03-21 LAB — I-STAT CG4 LACTIC ACID, ED: Lactic Acid, Venous: 1.02 mmol/L (ref 0.5–2.2)

## 2014-03-21 MED ORDER — SODIUM CHLORIDE 0.9 % IV BOLUS (SEPSIS): 1000.0000 mL | Freq: Once | INTRAVENOUS | Status: DC

## 2014-03-21 MED ORDER — KETOROLAC TROMETHAMINE 30 MG/ML IJ SOLN
30.0000 mg | Freq: Once | INTRAMUSCULAR | Status: AC
  Administered 2014-03-21: 30 mg via INTRAVENOUS
  Filled 2014-03-21: qty 1

## 2014-03-21 MED ORDER — ONDANSETRON HCL 4 MG/2ML IJ SOLN
4.0000 mg | Freq: Once | INTRAMUSCULAR | Status: AC
  Administered 2014-03-21: 4 mg via INTRAVENOUS
  Filled 2014-03-21: qty 2

## 2014-03-21 MED ORDER — SODIUM CHLORIDE 0.9 % IV BOLUS (SEPSIS)
1000.0000 mL | Freq: Once | INTRAVENOUS | Status: AC
  Administered 2014-03-21: 1000 mL via INTRAVENOUS

## 2014-03-21 NOTE — ED Provider Notes (Signed)
This chart was scribed for Karina MawKristen N Louise Rawson, DO by Milly JakobJohn Lee Graves, ED Scribe. The patient was seen in room APA05/APA05. Patient's care was started at 5:19 PM.  CHIEF COMPLAINT: Nausea, vomiting  HPI: HPI Comments: Karina Palmer is a [age] y.o. female history of ADHD who presents to the Emergency Department complaining of recurrent vomiting.  She reports associated nausea and vomiting which began 6 days ago. She reports that she has been constipated but has been passing gas.  She was seen here 2 days ago for similar symptoms and diagnosed with a UTI. She reports she does have some central abdominal pain that she describes as a discomfort. No aggravating or relieving factors. No radiation. Pain is described as mild. She reports that the she has taken her prescribed ABX and nausea medication with minimal relief, and that she has had trouble keeping them down. She denies any abnormal vaginal bleeding or discharge. Reports her last menstrual period was 2-3 months ago. States she is on oral birth control pills and that she takes her packs consecutively since she does not have a period. She denies any fevers or chills. No bloody stool or melena. No dysuria or hematuria, urinary frequency or urgency. No flank pain. No history of kidney stones.  PCP: Milana ObeyKNOWLTON,STEPHEN D, MD  ROS: See HPI Constitutional: no fever  Eyes: no drainage  ENT: no runny nose   Cardiovascular:  no chest pain  Resp: no SOB  GI: nausea, vomiting, and abdominal pain GU: no dysuria Integumentary: no rash  Allergy: no hives  Musculoskeletal: no leg swelling  Neurological: no slurred speech ROS otherwise negative  PAST MEDICAL HISTORY/PAST SURGICAL HISTORY:  Past Medical History  Diagnosis Date  . ADHD (attention deficit hyperactivity disorder)   . Insomnia   . Menstrual extraction 07/06/2013    MEDICATIONS:  Prior to Admission medications   Medication Sig Start Date End Date Taking? Authorizing Provider  albuterol (VENTOLIN  HFA) 108 (90 BASE) MCG/ACT inhaler Inhale 2 puffs into the lungs every 6 (six) hours as needed. FOR SYMPTOMS    Historical Provider, MD  cephALEXin (KEFLEX) 500 MG capsule Take 1 capsule (500 mg total) by mouth 4 (four) times daily. 03/19/14   Vanetta MuldersScott Zackowski, MD  ondansetron (ZOFRAN ODT) 4 MG disintegrating tablet Take 1 tablet (4 mg total) by mouth every 8 (eight) hours as needed. 03/19/14   Vanetta MuldersScott Zackowski, MD  promethazine (PHENERGAN) 12.5 MG tablet Take 1 tablet (12.5 mg total) by mouth every 6 (six) hours as needed for nausea or vomiting. 03/19/14   Vanetta MuldersScott Zackowski, MD  promethazine (PHENERGAN) 25 MG suppository Place 1 suppository (25 mg total) rectally every 6 (six) hours as needed for nausea. 03/09/11 03/16/11  Burgess AmorJulie Idol, PA-C    ALLERGIES:  No Known Allergies  SOCIAL HISTORY:  History  Substance Use Topics  . Smoking status: Current Every Day Smoker -- 0.50 packs/day for 5 years    Types: Cigars  . Smokeless tobacco: Never Used  . Alcohol Use: Yes     Comment: occasional drinker 4-6 beers a week    FAMILY HISTORY: History reviewed. No pertinent family history.  EXAM: Triage Vitals: BP 121/83  Pulse 67  Temp(Src) 98.3 F (36.8 C) (Oral)  Resp 16  Ht 5\' 5"  (1.651 m)  Wt 96 lb (43.545 kg)  BMI 15.98 kg/m2  SpO2 100% CONSTITUTIONAL: Alert and oriented and responds appropriately to questions. Well-appearing; well-nourished, nontoxic, in no distress HEAD: Normocephalic EYES: Conjunctivae clear, PERRL ENT: normal nose; no  rhinorrhea; moist mucous membranes; pharynx without lesions noted NECK: Supple, no meningismus, no LAD  CARD: RRR; S1 and S2 appreciated; no murmurs, no clicks, no rubs, no gallops RESP: Normal chest excursion without splinting or tachypnea; breath sounds clear and equal bilaterally; no wheezes, no rhonchi, no rales,  ABD/GI: Normal bowel sounds; non-distended; soft, non-tender, no rebound, no guarding, no peritoneal signs BACK:  The back appears normal and  is non-tender to palpation, there is no CVA tenderness EXT: Normal ROM in all joints; non-tender to palpation; no edema; normal capillary refill; no cyanosis    SKIN: Normal color for age and race; warm NEURO: Moves all extremities equally PSYCH: The patient's mood and manner are appropriate. Grooming and personal hygiene are appropriate.  MEDICAL DECISION MAKING: Patient here with vomiting. Recently diagnosed with UTI. Her abdominal exam is benign. We'll repeat labs and urine. Will give IV fluids, Toradol and Zofran. Doubt pyelonephritis given she has no flank pain and is well-appearing, afebrile and otherwise hemodynamically stable. Patient reports she would like discharge home if we can control her symptoms.  ED PROGRESS: Labs unremarkable. Patient's urine shows no sign of infection but there is a moderate amount of ketones. We'll continue IV hydration. We'll by mouth challenge.   8:00 PM  Pt tolerating by mouth. She reports feeling much better after IV Toradol, Zofran and fluids. We'll discharge home. We'll have her continue her Keflex for her UTI. She was recently discharged with prescriptions for Zofran and Phenergan. Discussed return precautions. Patient verbalizes understanding and is comfortable with plan.     I personally performed the services described in this documentation, which was scribed in my presence. The recorded information has been reviewed and is accurate.   Karina Maw Kensley Valladares, DO 03/21/14 [phone]

## 2014-03-21 NOTE — Discharge Instructions (Signed)

## 2014-03-21 NOTE — ED Notes (Signed)
Pt with abd pain with N/V since Friday, was dx with UTI on Monday, states nausea meds is not working, not able to keep antibiotics down

## 2014-06-18 ENCOUNTER — Telehealth: Payer: Self-pay | Admitting: Adult Health

## 2014-06-18 NOTE — Telephone Encounter (Signed)
Spoke with pt. Pt has an appt on 07/09/14 for physical. She will run out of birth control before her appt. She is on Lo Loestrin Fe. Can you order a refill? Thanks!! JSY

## 2014-06-19 ENCOUNTER — Other Ambulatory Visit: Payer: Self-pay | Admitting: Adult Health

## 2014-06-19 NOTE — Telephone Encounter (Signed)
Left message can get a sample of OCs

## 2014-07-09 ENCOUNTER — Ambulatory Visit (INDEPENDENT_AMBULATORY_CARE_PROVIDER_SITE_OTHER): Payer: Medicaid Other | Admitting: Adult Health

## 2014-07-09 ENCOUNTER — Encounter: Payer: Self-pay | Admitting: Adult Health

## 2014-07-09 VITALS — BP 102/60 | HR 72 | Ht 65.0 in | Wt 106.5 lb

## 2014-07-09 DIAGNOSIS — Z01419 Encounter for gynecological examination (general) (routine) without abnormal findings: Secondary | ICD-10-CM

## 2014-07-09 DIAGNOSIS — Z7689 Persons encountering health services in other specified circumstances: Secondary | ICD-10-CM

## 2014-07-09 DIAGNOSIS — N946 Dysmenorrhea, unspecified: Secondary | ICD-10-CM | POA: Insufficient documentation

## 2014-07-09 DIAGNOSIS — Z0000 Encounter for general adult medical examination without abnormal findings: Secondary | ICD-10-CM

## 2014-07-09 HISTORY — DX: Dysmenorrhea, unspecified: N94.6

## 2014-07-09 MED ORDER — NORETHIN-ETH ESTRAD-FE BIPHAS 1 MG-10 MCG / 10 MCG PO TABS: 1.0000 | ORAL_TABLET | Freq: Every day | ORAL | Status: DC

## 2014-07-09 MED ORDER — IBUPROFEN 800 MG PO TABS: 800.0000 mg | ORAL_TABLET | Freq: Three times a day (TID) | ORAL | Status: DC | PRN

## 2014-07-09 NOTE — Progress Notes (Signed)
Patient ID: Karina Palmer, female   DOB: 1990-10-11, 24 y.o.   MRN: [id number] History of Present Illness: Karina Palmer is a 24 year old black female in for gyn exam and refill OCs.Complains of cramps.Had normal pap 07/06/13.   Current Medications, Allergies, Past Medical History, Past Surgical History, Family History and Social History were reviewed in [name].     Review of Systems: Patient denies any headaches, blurred vision, shortness of breath, chest pain, abdominal pain, problems with bowel movements, urination, or intercourse(has female partner). No joint pain or mood swings, takes OCs continuously and feels much better but has some cramps.    Physical Exam:BP 102/60 mmHg  Pulse 72  Ht 5\' 5"  (1.651 m)  Wt 106 lb 8 oz (48.308 kg)  BMI 17.72 kg/m2 General:  Well developed, well nourished, no acute distress Skin:  Warm and dry, has several tattos Neck:  Midline trachea, normal thyroid Lungs; Clear to auscultation bilaterally Breast:  No dominant palpable mass, retraction, or nipple discharge Cardiovascular: Regular rate and rhythm Abdomen:  Soft, non tender, no hepatosplenomegaly Pelvic:  External genitalia is normal in appearance, no lesions.  The vagina is normal in appearance.     The cervix is smooth and tiny.  Uterus is felt to be normal size, shape, and contour.  No                adnexal masses or tenderness noted. Extremities:  No swelling or varicosities noted Psych:  No mood changes,alert and cooperative,seems happy   Impression: Well woman gyn exam no pap Period management Period cramps    Plan: Refilled lo loestrin x 1 year Rx motrin 800 mg #60 1 every 8 hours prn cramps with 1 refill Return in 1 year for pap and physical Review handout on dysmenorrhea

## 2014-07-09 NOTE — Patient Instructions (Signed)

## 2015-06-11 ENCOUNTER — Other Ambulatory Visit: Payer: Self-pay | Admitting: Adult Health

## 2015-07-02 ENCOUNTER — Other Ambulatory Visit: Payer: Self-pay | Admitting: Adult Health

## 2015-07-15 ENCOUNTER — Ambulatory Visit (INDEPENDENT_AMBULATORY_CARE_PROVIDER_SITE_OTHER): Payer: Medicaid Other | Admitting: Adult Health

## 2015-07-15 ENCOUNTER — Other Ambulatory Visit (HOSPITAL_COMMUNITY)
Admission: RE | Admit: 2015-07-15 | Discharge: 2015-07-15 | Disposition: A | Discharge status: Home / Self Care | Payer: Medicaid Other | Source: Ambulatory Visit | Attending: Adult Health | Admitting: Adult Health

## 2015-07-15 ENCOUNTER — Encounter: Payer: Self-pay | Admitting: Adult Health

## 2015-07-15 VITALS — BP 118/50 | HR 82 | Ht 65.0 in | Wt 105.5 lb

## 2015-07-15 DIAGNOSIS — Z124 Encounter for screening for malignant neoplasm of cervix: Secondary | ICD-10-CM

## 2015-07-15 DIAGNOSIS — Z113 Encounter for screening for infections with a predominantly sexual mode of transmission: Secondary | ICD-10-CM | POA: Insufficient documentation

## 2015-07-15 DIAGNOSIS — N946 Dysmenorrhea, unspecified: Secondary | ICD-10-CM

## 2015-07-15 DIAGNOSIS — Z01419 Encounter for gynecological examination (general) (routine) without abnormal findings: Secondary | ICD-10-CM

## 2015-07-15 DIAGNOSIS — Z0000 Encounter for general adult medical examination without abnormal findings: Secondary | ICD-10-CM | POA: Diagnosis not present

## 2015-07-15 DIAGNOSIS — Z7689 Persons encountering health services in other specified circumstances: Secondary | ICD-10-CM

## 2015-07-15 MED ORDER — NORETHIN-ETH ESTRAD-FE BIPHAS 1 MG-10 MCG / 10 MCG PO TABS: 1.0000 | ORAL_TABLET | Freq: Every day | ORAL | Status: DC

## 2015-07-15 NOTE — Progress Notes (Signed)
Patient ID: Karina Palmer, female   DOB: 1991-05-19, 25 y.o.   MRN: [id number] History of Present Illness: Karina Palmer is a 25 year old black female in for well woman gyn exam and pap.Has period cramps if has period.Got flu shot at PCP. PCP is Dr Sudie Bailey.   Current Medications, Allergies, Past Medical History, Past Surgical History, Family History and Social History were reviewed in [name] record.     Review of Systems: Patient denies any headaches, hearing loss, fatigue, blurred vision, shortness of breath, chest pain, abdominal pain, problems with bowel movements, urination, or intercourse(not currently, but has female partner). No joint pain or mood swings.    Physical Exam:BP 118/50 mmHg  Pulse 82  Ht  (1.651 m)  Wt 105 lb 8 oz (47.854 kg)  BMI 17.56 kg/m2 General:  Well developed, well nourished, no acute distress Skin:  Warm and dry Neck:  Midline trachea, normal thyroid, good ROM, no lymphadenopathy Lungs; Clear to auscultation bilaterally Breast:  No dominant palpable mass, retraction, or nipple discharge Cardiovascular: Regular rate and rhythm Abdomen:  Soft, non tender, no hepatosplenomegaly Pelvic:  External genitalia is normal in appearance, no lesions.  The vagina is normal in appearance. Urethra has no lesions or masses. The cervix is tiny, and deviated to the left, pap with GC/CHL performed.Marland Kitchen  Uterus is felt to be normal size, shape, and contour.  No adnexal masses or tenderness noted.Bladder is non tender, no masses felt. Extremities/musculoskeletal:  No swelling or varicosities noted, no clubbing or cyanosis Psych:  No mood changes, alert and cooperative,seems happy She wants to continue lo loestrin, encouraged to stop smoking   Impression:  Well woman gyn exam with pap Period management Period cramps    Plan: Refilled lo loestrin x 1 year Physical in 1 year,pap in 3 if normal Follow up with PCP for labs

## 2015-07-15 NOTE — Patient Instructions (Signed)
Physical in 1 year, pap in 3 if normal Follow up with Dr Sudie Bailey

## 2015-07-17 LAB — CYTOLOGY - PAP

## 2015-11-20 ENCOUNTER — Telehealth: Payer: Self-pay | Admitting: Adult Health

## 2015-11-20 NOTE — Telephone Encounter (Signed)
Spoke with pt. Pt is on Lo Loestrin. Pt has bad period pains. Pt states she don't take the white and brown pills because they make her sick. It's to early for pt to get filled at pharmacy. Pt is requesting a sample of Lo Loestrin. 1 sample box given. Lot # [id number] A exp 2/18. Pt to pick up at front desk. JSY

## 2015-11-23 ENCOUNTER — Other Ambulatory Visit: Payer: Self-pay | Admitting: Adult Health

## 2016-02-28 ENCOUNTER — Other Ambulatory Visit: Payer: Self-pay | Admitting: Adult Health

## 2016-06-21 ENCOUNTER — Encounter (HOSPITAL_COMMUNITY): Payer: Self-pay

## 2016-06-21 ENCOUNTER — Emergency Department (HOSPITAL_COMMUNITY)
Admission: EM | Admit: 2016-06-21 | Discharge: 2016-06-21 | Discharge status: Against Medical Advice | Payer: Medicaid Other | Attending: Emergency Medicine | Admitting: Emergency Medicine

## 2016-06-21 DIAGNOSIS — R5383 Other fatigue: Secondary | ICD-10-CM | POA: Diagnosis not present

## 2016-06-21 DIAGNOSIS — R112 Nausea with vomiting, unspecified: Secondary | ICD-10-CM | POA: Insufficient documentation

## 2016-06-21 DIAGNOSIS — F909 Attention-deficit hyperactivity disorder, unspecified type: Secondary | ICD-10-CM | POA: Insufficient documentation

## 2016-06-21 DIAGNOSIS — R509 Fever, unspecified: Secondary | ICD-10-CM | POA: Insufficient documentation

## 2016-06-21 DIAGNOSIS — R1010 Upper abdominal pain, unspecified: Secondary | ICD-10-CM

## 2016-06-21 DIAGNOSIS — F17290 Nicotine dependence, other tobacco product, uncomplicated: Secondary | ICD-10-CM | POA: Insufficient documentation

## 2016-06-21 DIAGNOSIS — R1011 Right upper quadrant pain: Secondary | ICD-10-CM | POA: Diagnosis not present

## 2016-06-21 LAB — COMPREHENSIVE METABOLIC PANEL
ALK PHOS: 65 U/L (ref 38–126)
ALT: 17 U/L (ref 14–54)
AST: 23 U/L (ref 15–41)
Albumin: 5 g/dL (ref 3.5–5.0)
Anion gap: 9 (ref 5–15)
BILIRUBIN TOTAL: 1 mg/dL (ref 0.3–1.2)
BUN: 12 mg/dL (ref 6–20)
CALCIUM: 9.9 mg/dL (ref 8.9–10.3)
CO2: 26 mmol/L (ref 22–32)
CREATININE: 0.63 mg/dL (ref 0.44–1.00)
Chloride: 102 mmol/L (ref 101–111)
GFR calc Af Amer: >60 mL/min (ref >60)
Glucose, Bld: 110 mg/dL — ABNORMAL HIGH (ref 65–99)
Potassium: 3.2 mmol/L — ABNORMAL LOW (ref 3.5–5.1)
Sodium: 137 mmol/L (ref 135–145)
TOTAL PROTEIN: 9.2 g/dL — AB (ref 6.5–8.1)

## 2016-06-21 LAB — CBC WITH DIFFERENTIAL/PLATELET
Basophils Absolute: 0 10*3/uL (ref 0.0–0.1)
Basophils Relative: 0 %
EOS PCT: 0 %
Eosinophils Absolute: 0 10*3/uL (ref 0.0–0.7)
HCT: 44.6 % (ref 36.0–46.0)
Hemoglobin: 14.8 g/dL (ref 12.0–15.0)
LYMPHS ABS: 1.4 10*3/uL (ref 0.7–4.0)
LYMPHS PCT: 18 %
MCH: 28.7 pg (ref 26.0–34.0)
MCHC: 33.2 g/dL (ref 30.0–36.0)
MCV: 86.4 fL (ref 78.0–100.0)
Monocytes Absolute: 0.6 10*3/uL (ref 0.1–1.0)
Monocytes Relative: 8 %
Neutro Abs: 5.9 10*3/uL (ref 1.7–7.7)
Neutrophils Relative %: 74 %
Platelets: 212 10*3/uL (ref 150–400)
RBC: 5.16 MIL/uL — AB (ref 3.87–5.11)
RDW: 13.5 % (ref 11.5–15.5)
WBC: 7.9 10*3/uL (ref 4.0–10.5)

## 2016-06-21 LAB — URINALYSIS, ROUTINE W REFLEX MICROSCOPIC
Bilirubin Urine: NEGATIVE
Glucose, UA: NEGATIVE mg/dL
HGB URINE DIPSTICK: NEGATIVE
Ketones, ur: NEGATIVE mg/dL
Leukocytes, UA: NEGATIVE
Nitrite: NEGATIVE
Protein, ur: 100 mg/dL — AB
SPECIFIC GRAVITY, URINE: 1.029 (ref 1.005–1.030)
pH: 5 (ref 5.0–8.0)

## 2016-06-21 LAB — AMYLASE: AMYLASE: 139 U/L — AB (ref 28–100)

## 2016-06-21 LAB — POC URINE PREG, ED: PREG TEST UR: NEGATIVE

## 2016-06-21 MED ORDER — ONDANSETRON HCL 4 MG/2ML IJ SOLN
4.0000 mg | Freq: Once | INTRAMUSCULAR | Status: AC
  Administered 2016-06-21: 4 mg via INTRAVENOUS
  Filled 2016-06-21: qty 2

## 2016-06-21 MED ORDER — FAMOTIDINE IN NACL 20-0.9 MG/50ML-% IV SOLN: 20.0000 mg | Freq: Once | INTRAVENOUS | Status: DC

## 2016-06-21 MED ORDER — SODIUM CHLORIDE 0.9 % IV SOLN
INTRAVENOUS | Status: DC
  Administered 2016-06-21: 19:00:00 via INTRAVENOUS

## 2016-06-21 NOTE — ED Triage Notes (Addendum)
Reports of vomiting that started yesterday with fatigue. Denies fever. Had 5 shots of liquor yesterday then started to vomit.

## 2016-06-21 NOTE — ED Provider Notes (Signed)
[facility]-EMER[facility]Y DEPT Provider Note   CSN: [id number] Arrival date & time: 06/21/16  1718  By signing my name below, I, Vista Minkobert Ross, attest that this documentation has been prepared under the direction and in the presence of [facility] Jess Toney NP.  Electronically Signed: Vista Minkobert Ross, ED Scribe. 06/21/16. 6:42 PM.   History   Chief Complaint Chief Complaint  Patient presents with  . Emesis    HPI HPI Comments: Karina Palmer is a 25 y.o. female, with no pertinent PMHx, who presents to the Emergency Department complaining of persistent nausea, vomiting with associated abdominal pain and fatigue that started yesterday. Pt reports approximately 10 episodes of vomiting today and yesterday. She has been unable to keep PO liquids down. Pt also reports subjective fever with chills but did not take her temperature. She has not taken any medications in attempt to relieve symptoms. No back pain. No diarrhea.  The history is provided by the patient. No language interpreter was used.    Past Medical History:  Diagnosis Date  . ADHD (attention deficit hyperactivity disorder)   . Insomnia   . Menstrual cramps 07/09/2014  . Menstrual extraction 07/06/2013    Patient Active Problem List   Diagnosis Date Noted  . Menstrual cramps 07/09/2014  . Menstrual extraction 07/06/2013    History reviewed. No pertinent surgical history.  OB History    Gravida Para Term Preterm AB Living   0 0 0 0 0 0   SAB TAB Ectopic Multiple Live Births   0 0 0 0         Home Medications    Prior to Admission medications   Medication Sig Start Date End Date Taking? Authorizing Provider  beclomethasone (QVAR) 80 MCG/ACT inhaler Inhale 2 puffs into the lungs as needed.   Yes Historical Provider, MD  ibuprofen (ADVIL,MOTRIN) 800 MG tablet TAKE (1) TABLET BY MOUTH EVERY EIGHT HOURS AS NEEDED. 02/28/16  Yes Adline PotterJennifer A Griffin, NP  lamoTRIgine (LAMICTAL) 25 MG tablet Take 25 mg by mouth 4 (four) times daily.   Yes  Historical Provider, MD  Norethindrone-Ethinyl Estradiol-Fe Biphas (LO LOESTRIN FE) 1 MG-10 MCG / 10 MCG tablet Take 1 tablet by mouth daily. 07/15/15  Yes Adline PotterJennifer A Griffin, NP  zolpidem (AMBIEN) 10 MG tablet Take 10 mg by mouth at bedtime as needed for sleep.   Yes Historical Provider, MD    Family History Family History  Problem Relation Age of Onset  . Other Mother     problems with ovaries    Social History Social History  Substance Use Topics  . Smoking status: Current Every Day Smoker    Packs/day: 0.25    Years: 7.00    Types: Cigars  . Smokeless tobacco: Never Used     Comment: smokes 2 cigars per day  . Alcohol use No    Allergies   Patient has no known allergies.   Review of Systems Review of Systems  Constitutional: Positive for fatigue and fever.  Gastrointestinal: Positive for abdominal pain, nausea and vomiting. Negative for diarrhea.  Genitourinary: Negative for flank pain.  Musculoskeletal: Negative for back pain.  All other systems reviewed and are negative.    Physical Exam Updated Vital Signs BP 141/82   Pulse (!) 57   Temp 98.2 F (36.8 C) (Oral)   Resp 18   Ht 5\' 6"  (1.676 m)   Wt 105 lb (47.6 kg)   LMP  (LMP Unknown)   SpO2 100%   BMI 16.95 kg/m  Physical Exam  Constitutional: She is oriented to person, place, and time. She appears well-developed and well-nourished. No distress.  HENT:  Head: Normocephalic and atraumatic.  Uvula midline, mild erythema, no edema  Neck: Normal range of motion.  Cardiovascular: Normal rate and regular rhythm.   Pulmonary/Chest: Effort normal.  Abdominal: Soft. Bowel sounds are normal. There is tenderness.  Tenderness in RUQ. No guarding, no rebound.  Musculoskeletal: She exhibits no edema or tenderness.  No CVA tenderness. No lower extremity edema.  Neurological: She is alert and oriented to person, place, and time.  Skin: Skin is warm and dry. She is not diaphoretic.  Psychiatric: She has a  normal mood and affect. Judgment normal.  Nursing note and vitals reviewed.   ED Treatments / Results  DIAGNOSTIC STUDIES: Oxygen Saturation is 100% on RA, normal by my interpretation.  COORDINATION OF CARE: 6:34 PM-Discussed treatment plan with pt at bedside and pt agreed to plan.   Labs (all labs ordered are listed, but only abnormal results are displayed) Labs Reviewed  URINALYSIS, ROUTINE W REFLEX MICROSCOPIC - Abnormal; Notable for the following:       Result Value   APPearance HAZY (*)    Protein, ur 100 (*)    Bacteria, UA RARE (*)    All other components within normal limits  CBC WITH DIFFERENTIAL/PLATELET - Abnormal; Notable for the following:    RBC 5.16 (*)    All other components within normal limits  COMPREHENSIVE METABOLIC PANEL - Abnormal; Notable for the following:    Potassium 3.2 (*)    Glucose, Bld 110 (*)    Total Protein 9.2 (*)    All other components within normal limits  AMYLASE - Abnormal; Notable for the following:    Amylase 139 (*)    All other components within normal limits  POC URINE PREG, ED   Radiology No results found.  Procedures Procedures (including critical care time)  Medications Ordered in ED Medications  0.9 %  sodium chloride infusion ( Intravenous Stopped 06/21/16 1944)  famotidine (PEPCID) IVPB 20 mg premix (20 mg Intravenous Not Given 06/21/16 2002)  ondansetron [facility](ZOFRAN) injection 4 mg (4 mg Intravenous Given 06/21/16 1907)     Initial Impression / Assessment and Plan / ED Course  I have reviewed the triage vital signs and the nursing notes.  Pertinent lab results that were available during my care of the patient were reviewed by me and considered in my medical decision making (see chart for details).  Clinical Course   25 y.o. female with n/v and RUQ abdominal pain and elevated Lipase left after IV hydration and medication for nausea. She left her room and went to the triage area and told the RN that she was feeling  better and was leaving. Stating that she had places to go and things to do tonight.   I am concerned that with the RUQ pain and the elevated Lipase that the patient may have had a gallbladder attack. Patient left without any discussion of the the lab results or plan of care.   Final Clinical Impressions(s) / ED Diagnoses  Abdominal pain   New Prescriptions Discharge Medication List as of 06/21/2016  8:01 PM    I personally performed the services described in this documentation, which was scribed in my presence. The recorded information has been reviewed and is accurate.     [place] M Aella Ronda, [place]NP 06/21/16 2051    Vanetta MuldersScott Zackowski, MD 06/22/16 [phone]

## 2016-06-21 NOTE — ED Notes (Signed)
Stopped by triage and said it is too much going on in the back and she is leaving. IV ACCESS REMOVED

## 2016-06-24 ENCOUNTER — Encounter (HOSPITAL_COMMUNITY): Payer: Self-pay | Admitting: Emergency Medicine

## 2016-06-24 ENCOUNTER — Emergency Department (HOSPITAL_COMMUNITY)
Admission: EM | Admit: 2016-06-24 | Discharge: 2016-06-24 | Disposition: A | Discharge status: Home / Self Care | Payer: Medicaid Other | Attending: Emergency Medicine | Admitting: Emergency Medicine

## 2016-06-24 DIAGNOSIS — R112 Nausea with vomiting, unspecified: Secondary | ICD-10-CM | POA: Diagnosis present

## 2016-06-24 DIAGNOSIS — F909 Attention-deficit hyperactivity disorder, unspecified type: Secondary | ICD-10-CM | POA: Insufficient documentation

## 2016-06-24 DIAGNOSIS — Z87891 Personal history of nicotine dependence: Secondary | ICD-10-CM | POA: Insufficient documentation

## 2016-06-24 DIAGNOSIS — E876 Hypokalemia: Secondary | ICD-10-CM

## 2016-06-24 DIAGNOSIS — Z79899 Other long term (current) drug therapy: Secondary | ICD-10-CM | POA: Diagnosis not present

## 2016-06-24 DIAGNOSIS — R1030 Lower abdominal pain, unspecified: Secondary | ICD-10-CM | POA: Diagnosis not present

## 2016-06-24 LAB — URINALYSIS, ROUTINE W REFLEX MICROSCOPIC
BILIRUBIN URINE: NEGATIVE
Bacteria, UA: NONE SEEN
Glucose, UA: NEGATIVE mg/dL
Ketones, ur: 20 mg/dL — AB
LEUKOCYTES UA: NEGATIVE
Nitrite: NEGATIVE
Protein, ur: 30 mg/dL — AB
SPECIFIC GRAVITY, URINE: 1.025 (ref 1.005–1.030)
pH: 6 (ref 5.0–8.0)

## 2016-06-24 LAB — COMPREHENSIVE METABOLIC PANEL
ALT: 38 U/L (ref 14–54)
ANION GAP: 11 (ref 5–15)
AST: 30 U/L (ref 15–41)
Albumin: 4.6 g/dL (ref 3.5–5.0)
Alkaline Phosphatase: 66 U/L (ref 38–126)
BILIRUBIN TOTAL: 1.2 mg/dL (ref 0.3–1.2)
BUN: 16 mg/dL (ref 6–20)
CALCIUM: 9.6 mg/dL (ref 8.9–10.3)
CO2: 26 mmol/L (ref 22–32)
Chloride: 99 mmol/L — ABNORMAL LOW (ref 101–111)
Creatinine, Ser: 0.73 mg/dL (ref 0.44–1.00)
GFR calc Af Amer: >60 mL/min (ref >60)
GFR calc non Af Amer: >60 mL/min (ref >60)
Glucose, Bld: 99 mg/dL (ref 65–99)
Potassium: 2.7 mmol/L — CL (ref 3.5–5.1)
Sodium: 136 mmol/L (ref 135–145)
TOTAL PROTEIN: 8.2 g/dL — AB (ref 6.5–8.1)

## 2016-06-24 LAB — CBC
HCT: 44.4 % (ref 36.0–46.0)
Hemoglobin: 14.6 g/dL (ref 12.0–15.0)
MCH: 28.8 pg (ref 26.0–34.0)
MCHC: 32.9 g/dL (ref 30.0–36.0)
MCV: 87.6 fL (ref 78.0–100.0)
Platelets: 187 10*3/uL (ref 150–400)
RBC: 5.07 MIL/uL (ref 3.87–5.11)
RDW: 13.1 % (ref 11.5–15.5)
WBC: 5.8 10*3/uL (ref 4.0–10.5)

## 2016-06-24 LAB — PREGNANCY, URINE: PREG TEST UR: NEGATIVE

## 2016-06-24 LAB — LIPASE, BLOOD: Lipase: 26 U/L (ref 11–51)

## 2016-06-24 MED ORDER — SODIUM CHLORIDE 0.9 % IV BOLUS (SEPSIS)
1000.0000 mL | Freq: Once | INTRAVENOUS | Status: AC
  Administered 2016-06-24: 1000 mL via INTRAVENOUS

## 2016-06-24 MED ORDER — POTASSIUM CHLORIDE 10 MEQ/100ML IV SOLN
10.0000 meq | INTRAVENOUS | Status: DC
  Administered 2016-06-24: 10 meq via INTRAVENOUS
  Filled 2016-06-24 (×2): qty 100

## 2016-06-24 MED ORDER — ONDANSETRON HCL 4 MG/2ML IJ SOLN
4.0000 mg | Freq: Once | INTRAMUSCULAR | Status: AC | PRN
  Administered 2016-06-24: 4 mg via INTRAVENOUS
  Filled 2016-06-24: qty 2

## 2016-06-24 MED ORDER — POTASSIUM CHLORIDE CRYS ER 20 MEQ PO TBCR: 40.0000 meq | EXTENDED_RELEASE_TABLET | Freq: Once | ORAL | Status: DC

## 2016-06-24 MED ORDER — POTASSIUM CHLORIDE 25 MEQ PO PACK: 1.0000 | PACK | Freq: Two times a day (BID) | ORAL | 0 refills | Status: DC

## 2016-06-24 MED ORDER — ONDANSETRON HCL 8 MG PO TABS: 8.0000 mg | ORAL_TABLET | ORAL | 0 refills | Status: DC | PRN

## 2016-06-24 MED ORDER — POTASSIUM CHLORIDE 10 MEQ/100ML IV SOLN
10.0000 meq | INTRAVENOUS | Status: AC
  Administered 2016-06-24: 10 meq via INTRAVENOUS

## 2016-06-24 NOTE — Discharge Instructions (Signed)
Increase fluids. Your potassium was low. Prescription for potassium and nausea. Eat potassium rich foods. Return if worse.

## 2016-06-24 NOTE — ED Provider Notes (Signed)
[facility] Provider Note   CSN: [id number] Arrival date & time: 06/24/16  1124  By signing my name below, I, Avnee Patel, attest that this documentation has been prepared under the direction and in the presence of Donnetta Hutching, MD  Electronically Signed: Clovis Pu, ED Scribe. 06/24/16. 12:04 PM.    History   Chief Complaint Chief Complaint  Patient presents with  . Emesis   The history is provided by the patient. No language interpreter was used.   HPI Comments:  Karina Palmer is a 26 y.o. female who presents to the Emergency Department complaining of sudden onset, intermittent vomiting x Friday. Pt also reports a  decrease in urinary output and lower abdominal pain. Her pain is worse upon palpation. No alleviating factors noted. Pt denies diarrhea, dysuria, any other associated symptoms and any other modifying factors at this time. Pt is ambulatory.    Past Medical History:  Diagnosis Date  . ADHD (attention deficit hyperactivity disorder)   . Insomnia   . Menstrual cramps 07/09/2014  . Menstrual extraction 07/06/2013    Patient Active Problem List   Diagnosis Date Noted  . Menstrual cramps 07/09/2014  . Menstrual extraction 07/06/2013    History reviewed. No pertinent surgical history.  OB History    Gravida Para Term Preterm AB Living   0 0 0 0 0 0   SAB TAB Ectopic Multiple Live Births   0 0 0 0         Home Medications    Prior to Admission medications   Medication Sig Start Date End Date Taking? Authorizing Provider  beclomethasone (QVAR) 80 MCG/ACT inhaler Inhale 2 puffs into the lungs as needed.   Yes Historical Provider, MD  dimenhyDRINATE (DRAMAMINE) 50 MG tablet Take 50 mg by mouth every 8 (eight) hours as needed for nausea or dizziness.   Yes Historical Provider, MD  ibuprofen (ADVIL,MOTRIN) 800 MG tablet TAKE (1) TABLET BY MOUTH EVERY EIGHT HOURS AS NEEDED. 02/28/16  Yes Adline Potter, NP  lamoTRIgine (LAMICTAL) 25 MG tablet Take 25  mg by mouth 4 (four) times daily.   Yes Historical Provider, MD  Norethindrone-Ethinyl Estradiol-Fe Biphas (LO LOESTRIN FE) 1 MG-10 MCG / 10 MCG tablet Take 1 tablet by mouth daily. 07/15/15  Yes Adline Potter, NP  zolpidem (AMBIEN) 10 MG tablet Take 10 mg by mouth at bedtime.    Yes Historical Provider, MD  ondansetron (ZOFRAN) 8 MG tablet Take 1 tablet (8 mg total) by mouth every 4 (four) hours as needed. 06/24/16   Donnetta Hutching, MD  Potassium Chloride 25 MEQ PACK Take 1 each by mouth 2 (two) times daily. 06/24/16   Donnetta Hutching, MD    Family History Family History  Problem Relation Age of Onset  . Other Mother     problems with ovaries    Social History Social History  Substance Use Topics  . Smoking status: Former Smoker    Packs/day: 0.25    Years: 7.00    Types: Cigars    Quit date: 06/17/2016  . Smokeless tobacco: Never Used     Comment: smokes 2 cigars per day  . Alcohol use No     Allergies   Patient has no known allergies.   Review of Systems Review of Systems  Gastrointestinal: Positive for vomiting. Negative for diarrhea.  Genitourinary: Negative for dysuria.  All other systems reviewed and are negative.    Physical Exam Updated Vital Signs BP 139/87   Pulse 65  Temp 97.9 F (36.6 C) (Oral)   Resp 18   Ht 5\' 6"  (1.676 m)   Wt 105 lb (47.6 kg)   LMP 06/24/2016   SpO2 100%   BMI 16.95 kg/m   Physical Exam  Constitutional: She is oriented to person, place, and time. She appears well-developed and well-nourished.  Looks slightly dehydrated  HENT:  Head: Normocephalic and atraumatic.  Eyes: Conjunctivae are normal.  Neck: Neck supple.  Cardiovascular: Normal rate and regular rhythm.   Pulmonary/Chest: Effort normal and breath sounds normal.  Abdominal: Soft. Bowel sounds are normal. There is tenderness.  Minimal suprapubic tenderness   Musculoskeletal: Normal range of motion.  Neurological: She is alert and oriented to person, place, and time.    Skin: Skin is warm and dry.  Psychiatric: She has a normal mood and affect. Her behavior is normal.  Nursing note and vitals reviewed.    ED Treatments / Results  DIAGNOSTIC STUDIES:  Oxygen Saturation is 100% on RA, normal by my interpretation.    COORDINATION OF CARE:  11:54 AM Will start IV fluids. Discussed treatment plan with pt at bedside and pt agreed to plan.  Labs (all labs ordered are listed, but only abnormal results are displayed) Labs Reviewed  COMPREHENSIVE METABOLIC PANEL - Abnormal; Notable for the following:       Result Value   Potassium 2.7 (*)    Chloride 99 (*)    Total Protein 8.2 (*)    All other components within normal limits  URINALYSIS, ROUTINE W REFLEX MICROSCOPIC - Abnormal; Notable for the following:    Color, Urine AMBER (*)    Hgb urine dipstick MODERATE (*)    Ketones, ur 20 (*)    Protein, ur 30 (*)    All other components within normal limits  LIPASE, BLOOD  CBC  PREGNANCY, URINE    EKG  EKG Interpretation None       Radiology No results found.  Procedures Procedures (including critical care time)  Medications Ordered in ED Medications  potassium chloride SA (K-DUR,KLOR-CON) CR tablet 40 mEq (0 mEq Oral Hold 06/24/16 1355)  potassium chloride 10 mEq in 100 mL IVPB (10 mEq Intravenous New Bag/Given 06/24/16 1503)  ondansetron (ZOFRAN) injection 4 mg (4 mg Intravenous Given 06/24/16 1209)  sodium chloride 0.9 % bolus 1,000 mL (0 mLs Intravenous Stopped 06/24/16 1458)  sodium chloride 0.9 % bolus 1,000 mL (0 mLs Intravenous Stopped 06/24/16 1355)  sodium chloride 0.9 % bolus 1,000 mL (1,000 mLs Intravenous New Bag/Given 06/24/16 1503)     Initial Impression / Assessment and Plan / ED Course  I have reviewed the triage vital signs and the nursing notes.  Pertinent labs & imaging results that were available during my care of the patient were reviewed by me and considered in my medical decision making (see chart for  details).  Clinical Course     Patient appeared dehydrated. She feels better after IV fluids and IV potassium. Discharge medications potassium chloride 25 mEq and Zofran 8 mg. She is encouraged to drink fluids  Final Clinical Impressions(s) / ED Diagnoses   Final diagnoses:  Intractable vomiting with nausea, unspecified vomiting type  Hypokalemia    New Prescriptions New Prescriptions   ONDANSETRON (ZOFRAN) 8 MG TABLET    Take 1 tablet (8 mg total) by mouth every 4 (four) hours as needed.   POTASSIUM CHLORIDE 25 MEQ PACK    Take 1 each by mouth 2 (two) times daily.  I personally performed  the services described in this documentation, which was scribed in my presence. The recorded information has been reviewed and is accurate.      Donnetta Hutching, MD 06/24/16 1535

## 2016-06-24 NOTE — ED Triage Notes (Signed)
Pt reports having emesis and lower abd pain since new years. Per pt she has not ate since Friday and cannot hold down fluids. Denies D/, fever.

## 2016-06-24 NOTE — ED Notes (Signed)
CRITICAL VALUE ALERT  Critical value received:  Potassium 2.7  Date of notification:  06/24/16  Time of notification:  1313  Critical value read back:Yes.    Nurse who received alert:  Fransico HimMeagan Verlan Grotz, RN  MD notified (1st page):  Dr. Adriana Simasook  Time of first page:  1316  MD notified (2nd page):  Time of second page:  Responding MD:  Dr. Adriana Simasook  Time MD responded:  1316

## 2016-07-15 ENCOUNTER — Other Ambulatory Visit: Payer: Medicaid Other | Admitting: Adult Health

## 2016-07-17 ENCOUNTER — Ambulatory Visit (INDEPENDENT_AMBULATORY_CARE_PROVIDER_SITE_OTHER): Payer: Medicaid Other | Admitting: Adult Health

## 2016-07-17 ENCOUNTER — Encounter: Payer: Self-pay | Admitting: Adult Health

## 2016-07-17 VITALS — BP 104/70 | HR 64 | Ht 65.0 in | Wt 101.5 lb

## 2016-07-17 DIAGNOSIS — F329 Major depressive disorder, single episode, unspecified: Secondary | ICD-10-CM

## 2016-07-17 DIAGNOSIS — F32A Depression, unspecified: Secondary | ICD-10-CM

## 2016-07-17 DIAGNOSIS — Z01419 Encounter for gynecological examination (general) (routine) without abnormal findings: Secondary | ICD-10-CM

## 2016-07-17 DIAGNOSIS — Z7689 Persons encountering health services in other specified circumstances: Secondary | ICD-10-CM

## 2016-07-17 DIAGNOSIS — Z113 Encounter for screening for infections with a predominantly sexual mode of transmission: Secondary | ICD-10-CM

## 2016-07-17 DIAGNOSIS — Z0000 Encounter for general adult medical examination without abnormal findings: Secondary | ICD-10-CM | POA: Diagnosis not present

## 2016-07-17 MED ORDER — NORETHIN-ETH ESTRAD-FE BIPHAS 1 MG-10 MCG / 10 MCG PO TABS: 1.0000 | ORAL_TABLET | Freq: Every day | ORAL | 12 refills | Status: DC

## 2016-07-17 NOTE — Progress Notes (Signed)
Patient ID: Karina Palmer, female   DOB: [birth year], 26 y.o.   MRN: [id number] History of Karina Palmer: Karina Palmer is a 26 year old black female in for well woman gyn exam, she had normal pap 07/15/15. PCP is Dr Sudie BaileyKnowlton.   Current Medications, Allergies, Past Medical History, Past Surgical History, Family History and Social History were reviewed in [name].     Review of Systems: Patient denies any headaches, hearing loss, fatigue, blurred vision, shortness of breath, chest pain, abdominal pain, problems with bowel movements, urination, or intercourse(not currently active). No joint pain or mood swings.Had bleeding this month on OCs and usually does not and has some low back pain, was recently in hospital for dehydration from vomiting.    Physical Exam:BP 104/70 (BP Location: Left Arm, Patient Position: Sitting, Cuff Size: Normal)   Pulse 64   Ht 5\' 5"  (1.651 m)   Wt 101 lb 8 oz (46 kg)   LMP 07/15/2016 (Exact Date)   BMI 16.89 kg/m  General:  Well developed, well nourished, no acute distress Skin:  Warm and dry Neck:  Midline trachea, normal thyroid, good ROM, no lymphadenopathy Lungs; Clear to auscultation bilaterally Breast:  No dominant palpable mass, retraction, or nipple discharge Cardiovascular: Regular rate and rhythm Abdomen:  Soft, non tender, no hepatosplenomegaly Pelvic:  External genitalia is normal in appearance, no lesions.  The vagina is normal in appearance, with period like blood. Urethra has no lesions or masses. The cervix is smooth and nulliparous.  Uterus is felt to be normal size, shape, and contour.  No adnexal masses or tenderness noted.Bladder is non tender, no masses felt.GC/CHL obtained Extremities/musculoskeletal:  No swelling or varicosities noted, no clubbing or cyanosis Psych:  No mood changes, alert and cooperative,seems happy PHQ 9 score 14, she is on meds and sees Dr Sudie BaileyKnowlton.  Impression:  1. Well woman exam with  routine gynecological exam   2. Encounter for menstrual regulation   3. Depression, unspecified depression type   4. Screening examination for STD (sexually transmitted disease)      Plan: Refilled lo loestrin disp 1 pack take 1 daily with 12 refills GC/CHL sent Physical in 1 year Pap 2020 Follow up with Dr Sudie BaileyKnowlton about depression

## 2016-07-17 NOTE — Patient Instructions (Signed)
Physical in 1 year 

## 2016-07-20 LAB — GC/CHLAMYDIA PROBE AMP
Chlamydia trachomatis, NAA: NEGATIVE
NEISSERIA GONORRHOEAE BY PCR: NEGATIVE

## 2016-08-12 ENCOUNTER — Other Ambulatory Visit: Payer: Self-pay | Admitting: Adult Health

## 2017-02-16 ENCOUNTER — Ambulatory Visit: Payer: Medicaid Other | Admitting: Obstetrics & Gynecology

## 2017-02-25 ENCOUNTER — Encounter (INDEPENDENT_AMBULATORY_CARE_PROVIDER_SITE_OTHER): Payer: Self-pay

## 2017-02-25 ENCOUNTER — Encounter: Payer: Self-pay | Admitting: Obstetrics & Gynecology

## 2017-02-25 ENCOUNTER — Ambulatory Visit (INDEPENDENT_AMBULATORY_CARE_PROVIDER_SITE_OTHER): Payer: Medicaid Other | Admitting: Obstetrics & Gynecology

## 2017-02-25 VITALS — BP 80/50 | HR 82 | Wt 103.0 lb

## 2017-02-25 DIAGNOSIS — N946 Dysmenorrhea, unspecified: Secondary | ICD-10-CM

## 2017-02-25 DIAGNOSIS — Z113 Encounter for screening for infections with a predominantly sexual mode of transmission: Secondary | ICD-10-CM | POA: Diagnosis not present

## 2017-02-25 DIAGNOSIS — R102 Pelvic and perineal pain: Secondary | ICD-10-CM

## 2017-02-25 DIAGNOSIS — G8929 Other chronic pain: Secondary | ICD-10-CM

## 2017-02-25 MED ORDER — KETOROLAC TROMETHAMINE 10 MG PO TABS: 10.0000 mg | ORAL_TABLET | Freq: Three times a day (TID) | ORAL | 0 refills | Status: DC | PRN

## 2017-02-25 MED ORDER — MEGESTROL ACETATE 40 MG PO TABS: ORAL_TABLET | ORAL | 3 refills | Status: DC

## 2017-02-25 NOTE — Addendum Note (Signed)
Addended by: Federico FlakeNES, Kain Milosevic A on: 02/25/2017 10:29 AM   Modules accepted: Orders

## 2017-02-25 NOTE — Progress Notes (Signed)
Chief Complaint  Patient presents with  . gyn visit    frequent period/ cramping and abdominal pain/last period 02-05-17    Blood pressure (!) 80/50, pulse 82, weight 103 lb (46.7 kg), last menstrual period 02/24/2017.  26 y.o. G0P0000 Patient's last menstrual period was 02/24/2017. The current method of family planning is OCP (estrogen/progesterone).  Outpatient Encounter Prescriptions as of 02/25/2017  Medication Sig  . beclomethasone (QVAR) 80 MCG/ACT inhaler Inhale 2 puffs into the lungs as needed.  . IBU 800 MG tablet TAKE (1) TABLET BY MOUTH EVERY EIGHT HOURS AS NEEDED.  Marland Kitchen lamoTRIgine (LAMICTAL) 25 MG tablet Take 25 mg by mouth 4 (four) times daily.  . Norethindrone-Ethinyl Estradiol-Fe Biphas (LO LOESTRIN FE) 1 MG-10 MCG / 10 MCG tablet Take 1 tablet by mouth daily.  Marland Kitchen zolpidem (AMBIEN) 10 MG tablet Take 10 mg by mouth at bedtime.   Marland Kitchen ketorolac (TORADOL) 10 MG tablet Take 1 tablet (10 mg total) by mouth every 8 (eight) hours as needed.  . megestrol (MEGACE) 40 MG tablet 3 tablets a day for 5 days, 2 tablets a day for 5 days then 1 tablet daily  . Potassium Chloride 25 MEQ PACK Take 1 each by mouth 2 (two) times daily. (Patient not taking: Reported on 02/25/2017)   No facility-administered encounter medications on file as of 02/25/2017.     Subjective Karina Palmer presents with increasing problems once again with pelvic pain She states she had chronic pelvic pain years 4 years the got significantly better couple years ago when she got started on birth control pills However the last few months her pain has gotten worse again and when she was not having bleeding before now she's having bleeding again on the Loestrin 10 The pain is not just with bleeding although is worse with bleeding and at times she says it feels like a sharp pain going up through her rectum Dyspareunia is not an issue  Objective General WDWN female NAD Vulva:  normal appearing vulva with no masses,  tenderness or lesions Vagina:  normal mucosa, scant blood Cervix:  Tender to palpation and putting a speculum in Uterus:  Uterus is normal size shape contour quite small but quite tender to palpation Adnexa: ovaries: Both active adnexa are normal size but significantly tender     Pertinent ROS No burning with urination, frequency or urgency No nausea, vomiting or diarrhea Nor fever chills or other constitutional symptoms   Labs or studies No new    Impression Diagnoses this Encounter::   ICD-10-CM   1. Dysmenorrhea N94.6 US PELVIS (TRANSABDOMINAL ONLY)    US Transvaginal Non-OB  2. Chronic pelvic pain in female R10.2 US PELVIS (TRANSABDOMINAL ONLY)   G89.29 US Transvaginal Non-OB    Established relevant diagnosis(es):   Plan/Recommendations: Meds ordered this encounter  Medications  . megestrol (MEGACE) 40 MG tablet    Sig: 3 tablets a day for 5 days, 2 tablets a day for 5 days then 1 tablet daily    Dispense:  45 tablet    Refill:  3  . ketorolac (TORADOL) 10 MG tablet    Sig: Take 1 tablet (10 mg total) by mouth every 8 (eight) hours as needed.    Dispense:  15 tablet    Refill:  0    Labs or Scans Ordered: Orders Placed This Encounter  Procedures  . US PELVIS (TRANSABDOMINAL ONLY)  . US Transvaginal Non-OB    Management:: Placing patient on the megestrol  algorithm and stop her low Loestrin Additionally, schedule her for an ultrasound in 2 weeks to see if she has any evidence of endometriosis I'm suspicious that may be the case A GC and chlamydia cultures also performed today I've given her prescription for Toradol for crampy pain  Follow up Return in about 2 weeks (around 03/11/2017) for GYN sono, Follow up, with Dr Despina HiddenEure.            All questions were answered.  Past Medical History:  Diagnosis Date  . ADHD (attention deficit hyperactivity disorder)   . Insomnia   . Menstrual cramps 07/09/2014  . Menstrual extraction 07/06/2013    History  reviewed. No pertinent surgical history.  OB History    Gravida Para Term Preterm AB Living   0 0 0 0 0 0   SAB TAB Ectopic Multiple Live Births   0 0 0 0        No Known Allergies  Social History   Social History  . Marital status: Single    Spouse name: N/A  . Number of children: N/A  . Years of education: N/A   Social History Main Topics  . Smoking status: Current Some Day Smoker    Packs/day: 0.00    Years: 10.00    Types: Cigars    Last attempt to quit: 06/17/2016  . Smokeless tobacco: Never Used     Comment: smokes 2 cigars daily  . Alcohol use No  . Drug use: No  . Sexual activity: Not Currently    Birth control/ protection: Pill   Other Topics Concern  . None   Social History Narrative  . None    Family History  Problem Relation Age of Onset  . Other Mother        problems with ovaries

## 2017-02-26 LAB — GC/CHLAMYDIA PROBE AMP
CHLAMYDIA, DNA PROBE: NEGATIVE
NEISSERIA GONORRHOEAE BY PCR: NEGATIVE

## 2017-03-11 ENCOUNTER — Ambulatory Visit (INDEPENDENT_AMBULATORY_CARE_PROVIDER_SITE_OTHER): Payer: Medicaid Other | Admitting: Obstetrics & Gynecology

## 2017-03-11 ENCOUNTER — Encounter: Payer: Self-pay | Admitting: Obstetrics & Gynecology

## 2017-03-11 ENCOUNTER — Ambulatory Visit (INDEPENDENT_AMBULATORY_CARE_PROVIDER_SITE_OTHER): Payer: Medicaid Other

## 2017-03-11 ENCOUNTER — Other Ambulatory Visit: Payer: Medicaid Other

## 2017-03-11 VITALS — BP 100/62 | HR 59 | Ht 65.0 in | Wt 102.5 lb

## 2017-03-11 DIAGNOSIS — N946 Dysmenorrhea, unspecified: Secondary | ICD-10-CM

## 2017-03-11 DIAGNOSIS — R102 Pelvic and perineal pain: Secondary | ICD-10-CM

## 2017-03-11 DIAGNOSIS — G8929 Other chronic pain: Secondary | ICD-10-CM

## 2017-03-11 MED ORDER — KETOROLAC TROMETHAMINE 10 MG PO TABS: 10.0000 mg | ORAL_TABLET | Freq: Three times a day (TID) | ORAL | 0 refills | Status: DC | PRN

## 2017-03-11 MED ORDER — MEGESTROL ACETATE 40 MG PO TABS: ORAL_TABLET | ORAL | 11 refills | Status: DC

## 2017-03-11 NOTE — Progress Notes (Signed)
Follow up appointment for results  Chief Complaint  Patient presents with  . Follow-up    pelvic u/s today    Blood pressure 100/62, pulse (!) 59, height  (1.651 m), weight 102 lb 8 oz (46.5 kg), last menstrual period 02/24/2017.  US Transvaginal Non-ob  Result Date: 03/11/2017 GYNECOLOGIC SONOGRAM Karina Palmer is a 26 y.o. G0P0000 LMP 02/24/2017 she is here for a pelvic sonogram for chronic pelvic pain. Uterus                      6.1 x 3.2 x 4 cm, vol 40.6 ml,homogeneous retroverted uterus,wnl Endometrium          3.8 mm, symmetrical, wnl Right ovary             2.8 x 1.5 x 1.7 cm, wnl Left ovary                2.4 x 1.6 x 2.2 cm, wnl Small amount of simple cul de sac fluid Technician Comments: PELVIC US TA/TV: homogeneous retroverted uterus,wnl,normal ovaries bilat,ovaries appear mobile,EEC 3.8 mm,small amount of simple cul de sac fluid [employer] 03/11/2017 10:43 AM Clinical Impression and recommendations: I have reviewed the sonogram results above, combined with the patient's current clinical course, below are my impressions and any appropriate recommendations for management based on the sonographic findings. Normal uterus and endometrium Normal ovaries EURE,LUTHER H 03/11/2017 11:11 AM   US Pelvis (transabdominal Only)  Result Date: 03/11/2017 GYNECOLOGIC SONOGRAM Karina Palmer is a 26 y.o. G0P0000 LMP 02/24/2017 she is here for a pelvic sonogram for chronic pelvic pain. Uterus                      6.1 x 3.2 x 4 cm, vol 40.6 ml,homogeneous retroverted uterus,wnl Endometrium          3.8 mm, symmetrical, wnl Right ovary             2.8 x 1.5 x 1.7 cm, wnl Left ovary                2.4 x 1.6 x 2.2 cm, wnl Small amount of simple cul de sac fluid Technician Comments: PELVIC US TA/TV: homogeneous retroverted uterus,wnl,normal ovaries bilat,ovaries appear mobile,EEC 3.8 mm,small amount of simple cul de sac fluid [employer] 03/11/2017 10:43 AM Clinical Impression and recommendations: I  have reviewed the sonogram results above, combined with the patient's current clinical course, below are my impressions and any appropriate recommendations for management based on the sonographic findings. Normal uterus and endometrium Normal ovaries EURE,LUTHER H 03/11/2017 11:11 AM    Discussed sonogram results in detail with patient and all questions answered  MEDS ordered this encounter: No orders of the defined types were placed in this encounter.   Orders for this encounter: No orders of the defined types were placed in this encounter.   Impression: Chronic pelvic pain in female  Dysmenorrhea    Plan: Continue megestrol 40 mg daily and toradol prn Will follow up 3 months Patient encouraged to send a message or call in the meantime if any problems or questions arise  Follow Up: Return in about 3 months (around 06/10/2017) for Follow up, with Dr Despina Hidden.       Face to face time:  10 minutes  Greater than 50% of the visit time was spent in counseling and coordination of care with the patient.  The summary and outline of the counseling  and care coordination is summarized in the note above.   All questions were answered.  Past Medical History:  Diagnosis Date  . ADHD (attention deficit hyperactivity disorder)   . Insomnia   . Menstrual cramps 07/09/2014  . Menstrual extraction 07/06/2013    History reviewed. No pertinent surgical history.  OB History    Gravida Para Term Preterm AB Living   0 0 0 0 0 0   SAB TAB Ectopic Multiple Live Births   0 0 0 0        No Known Allergies  Social History   Social History  . Marital status: Single    Spouse name: N/A  . Number of children: N/A  . Years of education: N/A   Social History Main Topics  . Smoking status: Current Some Day Smoker    Packs/day: 0.00    Years: 10.00    Types: Cigars    Last attempt to quit: 06/17/2016  . Smokeless tobacco: Never Used     Comment: smokes 2 cigars daily  . Alcohol use No   . Drug use: No  . Sexual activity: Not Currently    Birth control/ protection: None   Other Topics Concern  . None   Social History Narrative  . None    Family History  Problem Relation Age of Onset  . Other Mother        problems with ovaries

## 2017-03-11 NOTE — Progress Notes (Signed)
PELVIC US TA/TV: homogeneous retroverted uterus,wnl,normal ovaries bilat,ovaries appear mobile,EEC 3.8 mm,small amount of simple cul de sac fluid

## 2017-06-10 ENCOUNTER — Ambulatory Visit: Payer: Medicaid Other | Admitting: Obstetrics & Gynecology

## 2017-07-02 ENCOUNTER — Other Ambulatory Visit: Payer: Self-pay | Admitting: Obstetrics & Gynecology

## 2017-12-14 ENCOUNTER — Other Ambulatory Visit (HOSPITAL_COMMUNITY): Payer: Self-pay | Admitting: Family Medicine

## 2017-12-14 ENCOUNTER — Ambulatory Visit (HOSPITAL_COMMUNITY)
Admission: RE | Admit: 2017-12-14 | Discharge: 2017-12-14 | Disposition: A | Discharge status: Home / Self Care | Payer: Medicaid Other | Source: Ambulatory Visit | Attending: Family Medicine | Admitting: Family Medicine

## 2017-12-14 DIAGNOSIS — M546 Pain in thoracic spine: Secondary | ICD-10-CM | POA: Insufficient documentation

## 2017-12-14 DIAGNOSIS — R52 Pain, unspecified: Secondary | ICD-10-CM

## 2018-03-28 ENCOUNTER — Other Ambulatory Visit (HOSPITAL_COMMUNITY): Payer: Self-pay | Admitting: Nurse Practitioner

## 2018-03-28 ENCOUNTER — Ambulatory Visit (HOSPITAL_COMMUNITY)
Admission: RE | Admit: 2018-03-28 | Discharge: 2018-03-28 | Disposition: A | Discharge status: Home / Self Care | Payer: Medicaid Other | Source: Ambulatory Visit | Attending: Nurse Practitioner | Admitting: Nurse Practitioner

## 2018-03-28 DIAGNOSIS — M5440 Lumbago with sciatica, unspecified side: Secondary | ICD-10-CM | POA: Diagnosis present

## 2018-04-07 ENCOUNTER — Telehealth: Payer: Self-pay | Admitting: *Deleted

## 2018-04-07 NOTE — Telephone Encounter (Signed)
Patient called requesting refill on Megace. Please advise.  

## 2018-04-08 ENCOUNTER — Telehealth: Payer: Self-pay | Admitting: Obstetrics & Gynecology

## 2018-04-08 MED ORDER — MEGESTROL ACETATE 40 MG PO TABS: ORAL_TABLET | ORAL | 11 refills | Status: DC

## 2019-05-15 ENCOUNTER — Other Ambulatory Visit: Payer: Self-pay | Admitting: Obstetrics & Gynecology

## 2019-09-13 ENCOUNTER — Telehealth: Payer: Self-pay | Admitting: *Deleted

## 2019-09-13 NOTE — Telephone Encounter (Signed)
Pt is requesting a refill on Megace but she hasn't been seen since 2018. I advised she would need an appt and call was transferred to [facility] for appt. JSY

## 2019-09-13 NOTE — Telephone Encounter (Signed)
Pt left message she has 1 refill left.

## 2019-09-14 ENCOUNTER — Telehealth: Payer: Self-pay | Admitting: Adult Health

## 2019-09-14 NOTE — Telephone Encounter (Signed)

## 2019-09-18 ENCOUNTER — Other Ambulatory Visit: Payer: Self-pay

## 2019-09-18 ENCOUNTER — Encounter: Payer: Self-pay | Admitting: Adult Health

## 2019-09-18 ENCOUNTER — Ambulatory Visit (INDEPENDENT_AMBULATORY_CARE_PROVIDER_SITE_OTHER): Payer: Medicaid Other | Admitting: Adult Health

## 2019-09-18 VITALS — BP 140/82 | HR 99 | Ht 65.0 in | Wt 115.0 lb

## 2019-09-18 DIAGNOSIS — Z7689 Persons encountering health services in other specified circumstances: Secondary | ICD-10-CM | POA: Diagnosis not present

## 2019-09-18 DIAGNOSIS — N946 Dysmenorrhea, unspecified: Secondary | ICD-10-CM

## 2019-09-18 MED ORDER — MEGESTROL ACETATE 40 MG PO TABS: ORAL_TABLET | ORAL | 1 refills | Status: DC

## 2019-09-18 NOTE — Progress Notes (Signed)
  Subjective:     Patient ID: Karina Palmer, female   DOB: 09-Nov-1990, 29 y.o.   MRN: [id number]  HPI Karina Palmer is a 29 year old black female,single, G0P0, in to get megace refilled, take 40 mg 1 daily to stop period and pain associated with her period.She says she feels good, now. Has been on 1 megace for almost 3 years and is happy with it, and has gained a little weight.  Last pap 2017 and normal. Had normal [place] 03/11/2017.  PCP is Dr Sudie Bailey.   Review of Systems Patient denies any headaches, hearing loss, fatigue, blurred vision, shortness of breath, chest pain, abdominal pain, problems with bowel movements, urination, or intercourse(not active). No joint pain or mood swings. See HPI for positives.  Reviewed past medical,surgical, social and family history. Reviewed medications and allergies.     Objective:   Physical Exam BP 140/82 (BP Location: Left Arm, Patient Position: Sitting, Cuff Size: Normal)   Pulse 99   Ht 5\' 5"  (1.651 m)   Wt 115 lb (52.2 kg)   BMI 19.14 kg/m  Skin warm and dry. Lungs: clear to ausculation bilaterally. Cardiovascular: regular rate and rhythm.   Alcohol audit is 0, fall risk is low.PHQ 2 score is 0. Discussed with Dr , will continue megace.   Assessment:     1. Dysmenorrhea Will refill megace Meds ordered this encounter  Medications  . megestrol (MEGACE) 40 MG tablet    Sig: TAKE 1 daily    Dispense:  30 tablet    Refill:  1    Order Specific Question:   Supervising Provider    Answer:   Despina Hidden, LUTHER H [[id number]]    2. Encounter for menstrual regulation Can continue megace    Plan:     Return 4/29 for pap and physical

## 2019-10-18 ENCOUNTER — Telehealth: Payer: Self-pay | Admitting: Adult Health

## 2019-10-18 NOTE — Telephone Encounter (Signed)

## 2019-10-19 ENCOUNTER — Other Ambulatory Visit: Payer: Self-pay

## 2019-10-19 ENCOUNTER — Ambulatory Visit (INDEPENDENT_AMBULATORY_CARE_PROVIDER_SITE_OTHER): Payer: Medicaid Other | Admitting: Adult Health

## 2019-10-19 ENCOUNTER — Encounter: Payer: Self-pay | Admitting: Adult Health

## 2019-10-19 ENCOUNTER — Other Ambulatory Visit (HOSPITAL_COMMUNITY)
Admission: RE | Admit: 2019-10-19 | Discharge: 2019-10-19 | Disposition: A | Discharge status: Home / Self Care | Payer: Medicaid Other | Source: Ambulatory Visit | Attending: Adult Health | Admitting: Adult Health

## 2019-10-19 VITALS — BP 112/76 | HR 84 | Ht 65.0 in | Wt 110.0 lb

## 2019-10-19 DIAGNOSIS — Z7689 Persons encountering health services in other specified circumstances: Secondary | ICD-10-CM | POA: Diagnosis not present

## 2019-10-19 DIAGNOSIS — Z01419 Encounter for gynecological examination (general) (routine) without abnormal findings: Secondary | ICD-10-CM

## 2019-10-19 DIAGNOSIS — Z1151 Encounter for screening for human papillomavirus (HPV): Secondary | ICD-10-CM | POA: Diagnosis not present

## 2019-10-19 DIAGNOSIS — Z0000 Encounter for general adult medical examination without abnormal findings: Secondary | ICD-10-CM | POA: Diagnosis not present

## 2019-10-19 DIAGNOSIS — N946 Dysmenorrhea, unspecified: Secondary | ICD-10-CM | POA: Diagnosis not present

## 2019-10-19 MED ORDER — MEGESTROL ACETATE 40 MG PO TABS: ORAL_TABLET | ORAL | 12 refills | Status: DC

## 2019-10-19 NOTE — Progress Notes (Signed)
Patient ID: Karina Palmer, female   DOB: 28-Feb-1991, 29 y.o.   MRN: [id number] History of Present Illness: Karina Palmer is a 29 year old black female,single,G0P0, in for well woman gyn exam and pap.She take megace daily to stop periods and pain associated with them. PCP is Dr Sudie Bailey.  Current Medications, Allergies, Past Medical History, Past Surgical History, Family History and Social History were reviewed in [name] record.     Review of Systems: Patient denies any headaches, hearing loss, fatigue, blurred vision, shortness of breath, chest pain, abdominal pain, problems with bowel movements, urination, or intercourse(has female partner). No joint pain or mood swings. See  HPI for positives.    Physical Exam:BP 112/76 (BP Location: Left Arm, Patient Position: Sitting, Cuff Size: Normal)   Pulse 84   Ht 5\' 5"  (1.651 m)   Wt 110 lb (49.9 kg)   BMI 18.30 kg/m  General:  Well developed, well nourished, no acute distress Skin:  Warm and dry Neck:  Midline trachea, normal thyroid, good ROM, no lymphadenopathy Lungs; Clear to auscultation bilaterally Breast:  No dominant palpable mass, retraction, or nipple discharge Cardiovascular: Regular rate and rhythm Abdomen:  Soft, non tender, no hepatosplenomegaly Pelvic:  External genitalia is normal in appearance, no lesions.  The vagina is normal in appearance. Urethra has no lesions or masses. The cervix is smooth and nulliparous,pap with high risk HPV performed .  Uterus is felt to be normal size, shape, and contour.  No adnexal masses or tenderness noted.Bladder is non tender, no masses felt. Let pt see her cervix with mirror Extremities/musculoskeletal:  No swelling or varicosities noted, no clubbing or cyanosis Psych:  No mood changes, alert and cooperative  AA is 0 Fall risk is low PHQ 9 score is 15, no SI is on Lamictal Examination chaperoned by LPN  Impression and Plan: 1. Encounter for  gynecological examination with Papanicolaou smear of cervix Pap sent Physical in 1 year Pap in 3 if normal mammogram at 40 Labs with PCP  2. Dysmenorrhea Will refill megace for 1 year  3. Encounter for menstrual regulation Will refill megace for 1 year If has sex with female, needs to wear a condom

## 2019-10-23 LAB — CYTOLOGY - PAP
Comment: NEGATIVE
Diagnosis: NEGATIVE
High risk HPV: NEGATIVE

## 2019-11-01 ENCOUNTER — Emergency Department (HOSPITAL_COMMUNITY)
Admission: EM | Admit: 2019-11-01 | Discharge: 2019-11-02 | Disposition: A | Discharge status: Home / Self Care | Payer: Medicaid Other | Attending: Emergency Medicine | Admitting: Emergency Medicine

## 2019-11-01 ENCOUNTER — Encounter (HOSPITAL_COMMUNITY): Payer: Self-pay | Admitting: Emergency Medicine

## 2019-11-01 ENCOUNTER — Other Ambulatory Visit: Payer: Self-pay

## 2019-11-01 DIAGNOSIS — Z5321 Procedure and treatment not carried out due to patient leaving prior to being seen by health care provider: Secondary | ICD-10-CM | POA: Insufficient documentation

## 2019-11-01 DIAGNOSIS — R1110 Vomiting, unspecified: Secondary | ICD-10-CM | POA: Insufficient documentation

## 2019-11-01 LAB — CBC
HCT: 43.5 % (ref 36.0–46.0)
Hemoglobin: 14.5 g/dL (ref 12.0–15.0)
MCH: 29.3 pg (ref 26.0–34.0)
MCHC: 33.3 g/dL (ref 30.0–36.0)
MCV: 87.9 fL (ref 80.0–100.0)
Platelets: 237 10*3/uL (ref 150–400)
RBC: 4.95 MIL/uL (ref 3.87–5.11)
RDW: 13.2 % (ref 11.5–15.5)
WBC: 10 10*3/uL (ref 4.0–10.5)
nRBC: 0 % (ref 0.0–0.2)

## 2019-11-01 LAB — COMPREHENSIVE METABOLIC PANEL
ALT: 17 U/L (ref 0–44)
AST: 25 U/L (ref 15–41)
Albumin: 5.1 g/dL — ABNORMAL HIGH (ref 3.5–5.0)
Alkaline Phosphatase: 60 U/L (ref 38–126)
Anion gap: 13 (ref 5–15)
BUN: 21 mg/dL — ABNORMAL HIGH (ref 6–20)
CO2: 23 mmol/L (ref 22–32)
Calcium: 10.4 mg/dL — ABNORMAL HIGH (ref 8.9–10.3)
Chloride: 102 mmol/L (ref 98–111)
Creatinine, Ser: 0.65 mg/dL (ref 0.44–1.00)
GFR calc Af Amer: >60 mL/min (ref >60)
GFR calc non Af Amer: >60 mL/min (ref >60)
Glucose, Bld: 120 mg/dL — ABNORMAL HIGH (ref 70–99)
Potassium: 3.1 mmol/L — ABNORMAL LOW (ref 3.5–5.1)
Sodium: 138 mmol/L (ref 135–145)
Total Bilirubin: 1 mg/dL (ref 0.3–1.2)
Total Protein: 9.1 g/dL — ABNORMAL HIGH (ref 6.5–8.1)

## 2019-11-01 LAB — LIPASE, BLOOD: Lipase: 26 U/L (ref 11–51)

## 2019-11-01 MED ORDER — ONDANSETRON 4 MG PO TBDP
4.0000 mg | ORAL_TABLET | Freq: Once | ORAL | Status: AC | PRN
  Administered 2019-11-01: 4 mg via ORAL
  Filled 2019-11-01: qty 1

## 2019-11-01 NOTE — ED Triage Notes (Signed)
Vomiting x 3 days, had a dental surgery last Tuesday

## 2019-11-02 ENCOUNTER — Encounter (HOSPITAL_COMMUNITY): Payer: Self-pay | Admitting: *Deleted

## 2019-11-02 ENCOUNTER — Emergency Department (HOSPITAL_COMMUNITY)
Admission: EM | Admit: 2019-11-02 | Discharge: 2019-11-02 | Disposition: A | Discharge status: Home / Self Care | Payer: Medicaid Other | Source: Home / Self Care | Attending: Emergency Medicine | Admitting: Emergency Medicine

## 2019-11-02 ENCOUNTER — Other Ambulatory Visit: Payer: Self-pay

## 2019-11-02 DIAGNOSIS — E860 Dehydration: Secondary | ICD-10-CM

## 2019-11-02 DIAGNOSIS — K529 Noninfective gastroenteritis and colitis, unspecified: Secondary | ICD-10-CM

## 2019-11-02 DIAGNOSIS — E876 Hypokalemia: Secondary | ICD-10-CM

## 2019-11-02 LAB — URINALYSIS, ROUTINE W REFLEX MICROSCOPIC
Bilirubin Urine: NEGATIVE
Glucose, UA: NEGATIVE mg/dL
Hgb urine dipstick: NEGATIVE
Ketones, ur: 5 mg/dL — AB
Leukocytes,Ua: NEGATIVE
Nitrite: NEGATIVE
Protein, ur: 100 mg/dL — AB
Specific Gravity, Urine: 1.027 (ref 1.005–1.030)
pH: 5 (ref 5.0–8.0)

## 2019-11-02 LAB — CBC WITH DIFFERENTIAL/PLATELET
Abs Immature Granulocytes: 0.02 10*3/uL (ref 0.00–0.07)
Basophils Absolute: 0 10*3/uL (ref 0.0–0.1)
Basophils Relative: 0 %
Eosinophils Absolute: 0 10*3/uL (ref 0.0–0.5)
Eosinophils Relative: 0 %
HCT: 45.2 % (ref 36.0–46.0)
Hemoglobin: 15 g/dL (ref 12.0–15.0)
Immature Granulocytes: 0 %
Lymphocytes Relative: 20 %
Lymphs Abs: 1.9 10*3/uL (ref 0.7–4.0)
MCH: 29.2 pg (ref 26.0–34.0)
MCHC: 33.2 g/dL (ref 30.0–36.0)
MCV: 87.9 fL (ref 80.0–100.0)
Monocytes Absolute: 0.8 10*3/uL (ref 0.1–1.0)
Monocytes Relative: 8 %
Neutro Abs: 6.7 10*3/uL (ref 1.7–7.7)
Neutrophils Relative %: 72 %
Platelets: 252 10*3/uL (ref 150–400)
RBC: 5.14 MIL/uL — ABNORMAL HIGH (ref 3.87–5.11)
RDW: 12.8 % (ref 11.5–15.5)
WBC: 9.4 10*3/uL (ref 4.0–10.5)
nRBC: 0 % (ref 0.0–0.2)

## 2019-11-02 LAB — I-STAT BETA HCG BLOOD, ED (MC, WL, AP ONLY): I-stat hCG, quantitative: <5 m[IU]/mL

## 2019-11-02 LAB — COMPREHENSIVE METABOLIC PANEL WITH GFR
ALT: 91 U/L — ABNORMAL HIGH (ref 0–44)
AST: 103 U/L — ABNORMAL HIGH (ref 15–41)
Albumin: 4.9 g/dL (ref 3.5–5.0)
Alkaline Phosphatase: 64 U/L (ref 38–126)
Anion gap: 13 (ref 5–15)
BUN: 20 mg/dL (ref 6–20)
CO2: 23 mmol/L (ref 22–32)
Calcium: 9.9 mg/dL (ref 8.9–10.3)
Chloride: 100 mmol/L (ref 98–111)
Creatinine, Ser: 0.62 mg/dL (ref 0.44–1.00)
GFR calc Af Amer: >60 mL/min
GFR calc non Af Amer: >60 mL/min
Glucose, Bld: 111 mg/dL — ABNORMAL HIGH (ref 70–99)
Potassium: 2.8 mmol/L — ABNORMAL LOW (ref 3.5–5.1)
Sodium: 136 mmol/L (ref 135–145)
Total Bilirubin: 1.5 mg/dL — ABNORMAL HIGH (ref 0.3–1.2)
Total Protein: 9.1 g/dL — ABNORMAL HIGH (ref 6.5–8.1)

## 2019-11-02 LAB — LIPASE, BLOOD: Lipase: 28 U/L (ref 11–51)

## 2019-11-02 LAB — MAGNESIUM: Magnesium: 2.2 mg/dL (ref 1.7–2.4)

## 2019-11-02 MED ORDER — ONDANSETRON 4 MG PO TBDP
4.0000 mg | ORAL_TABLET | Freq: Three times a day (TID) | ORAL | 0 refills | Status: DC | PRN
Start: 2019-11-02 — End: 2021-03-17

## 2019-11-02 MED ORDER — LACTATED RINGERS IV BOLUS
2000.0000 mL | Freq: Once | INTRAVENOUS | Status: AC
  Administered 2019-11-02: 2000 mL via INTRAVENOUS

## 2019-11-02 MED ORDER — POTASSIUM CHLORIDE CRYS ER 20 MEQ PO TBCR
40.0000 meq | EXTENDED_RELEASE_TABLET | Freq: Once | ORAL | Status: AC
  Administered 2019-11-02: 40 meq via ORAL
  Filled 2019-11-02: qty 2

## 2019-11-02 MED ORDER — ONDANSETRON HCL 4 MG/2ML IJ SOLN
4.0000 mg | Freq: Once | INTRAMUSCULAR | Status: AC
  Administered 2019-11-02: 4 mg via INTRAVENOUS
  Filled 2019-11-02: qty 2

## 2019-11-02 MED ORDER — POTASSIUM CHLORIDE 20 MEQ/15ML (10%) PO SOLN: 20.0000 meq | Freq: Every day | ORAL | 0 refills | Status: DC

## 2019-11-02 NOTE — ED Notes (Signed)
Patient is slightly dizzy upon sitting and standing.

## 2019-11-02 NOTE — ED Triage Notes (Signed)
Pt states she feels weak and like she is going to pass out; pt states she was seen here yesterday for the same complaint and waited 4 hours without seeing a doctor; pt states she has been vomiting since Sunday

## 2019-11-02 NOTE — ED Provider Notes (Signed)
[facility] EMERGENCY DEPARTMENT Provider Note   CSN: [id number] Arrival date & time: 11/02/19  1612     History Chief Complaint  Patient presents with  . Emesis    Karina Palmer is a 29 y.o. female with past medical history of ADHD, bipolar 1, dysmenorrhea, who presents today for evaluation of 4 days of nausea and vomiting.  She reports she has been unable to tolerate water.  She denies any fevers.  No cough or shortness of breath.  She is fully vaccinated against Covid.  She states that the Tuesday before hand she had a dental surgery and had come off the pain medicines prior to the symptoms starting.  She denies any recent trauma.  She denies frequent or daily marijuana use prior to the onset of her symptoms.  Her symptoms are made worse by attempting to eat or drink.  No alleviating symptoms noted.  She denies any dysuria, increased frequency or urgency.  She states that prior to having diarrhea or vomiting she will have mild crampy belly pain however that only is around when she has vomiting or diarrhea and goes away after.  She denies abnormal vaginal discharge.  No pelvic pain.  HPI     Past Medical History:  Diagnosis Date  . ADHD (attention deficit hyperactivity disorder)   . Bipolar 1 disorder (HCC)   . Insomnia   . Menstrual cramps 07/09/2014  . Menstrual extraction 07/06/2013    Patient Active Problem List   Diagnosis Date Noted  . Encounter for gynecological examination with Papanicolaou smear of cervix 10/19/2019  . Dysmenorrhea 09/18/2019  . Encounter for menstrual regulation 09/18/2019  . Menstrual cramps 07/09/2014  . Menstrual extraction 07/06/2013    History reviewed. No pertinent surgical history.   OB History    Gravida  0   Para  0   Term  0   Preterm  0   AB  0   Living  0     SAB  0   TAB  0   Ectopic  0   Multiple  0   Live Births              Family History  Problem Relation Age of Onset  . Other Mother    problems with ovaries    Social History   Tobacco Use  . Smoking status: Current Some Day Smoker    Packs/day: 0.00    Years: 10.00    Pack years: 0.00    Types: Cigars    Last attempt to quit: 06/17/2016    Years since quitting: 3.3  . Smokeless tobacco: Never Used  . Tobacco comment: smokes 2 cigars daily  Substance Use Topics  . Alcohol use: No  . Drug use: No    Home Medications Prior to Admission medications   Medication Sig Start Date End Date Taking? Authorizing Provider  beclomethasone (QVAR) 80 MCG/ACT inhaler Inhale 2 puffs into the lungs daily as needed.    Yes [provider]  dimenhyDRINATE (DRAMAMINE) 50 MG tablet Take 50 mg by mouth every 8 (eight) hours as needed for nausea.   Yes [provider]  lamoTRIgine (LAMICTAL) 25 MG tablet Take 25 mg by mouth 4 (four) times daily.   Yes [provider]  megestrol (MEGACE) 40 MG tablet TAKE 1 daily Patient taking differently: Take 40 mg by mouth daily. TAKE 1 daily 10/19/19  Yes Cyril Mourning A, NP  zolpidem (AMBIEN) 10 MG tablet Take 10 mg by  mouth at bedtime.    Yes [provider]  ondansetron (ZOFRAN ODT) 4 MG disintegrating tablet Take 1 tablet (4 mg total) by mouth every 8 (eight) hours as needed for nausea or vomiting. 11/02/19   Cristina Gong, PA-C  potassium chloride 20 MEQ/15ML (10%) SOLN Take 15 mLs (20 mEq total) by mouth daily for 5 days. 11/02/19 11/07/19  Cristina Gong, PA-C    Allergies    Patient has no known allergies.  Review of Systems   Review of Systems  Constitutional: Negative for chills and fever.  Respiratory: Negative for chest tightness.   Gastrointestinal: Positive for abdominal pain, diarrhea (One episode, two days ago. ), nausea and vomiting. Negative for abdominal distention.  Genitourinary: Negative for dysuria and frequency.  Musculoskeletal: Negative for back pain and myalgias.  Neurological: Positive for light-headedness (when  standing). Negative for headaches.  Psychiatric/Behavioral: Negative for confusion.  All other systems reviewed and are negative.   Physical Exam Updated Vital Signs BP 128/89   Pulse 88   Temp 99.1 F (37.3 C) (Oral)   Resp 18   Ht 5\' 5"  (1.651 m)   Wt 49.9 kg   SpO2 100%   BMI 18.30 kg/m   Physical Exam Vitals and nursing note reviewed.  Constitutional:      General: She is not in acute distress.    Appearance: She is well-developed. She is not diaphoretic.  HENT:     Head: Normocephalic and atraumatic.     Mouth/Throat:     Mouth: Mucous membranes are moist.  Eyes:     General: No scleral icterus.       Right eye: No discharge.        Left eye: No discharge.     Conjunctiva/sclera: Conjunctivae normal.  Cardiovascular:     Rate and Rhythm: Normal rate and regular rhythm.     Pulses: Normal pulses.  Pulmonary:     Effort: Pulmonary effort is normal. No respiratory distress.     Breath sounds: No stridor.  Abdominal:     General: There is no distension.     Palpations: Abdomen is soft.     Tenderness: There is no abdominal tenderness. There is no guarding.  Musculoskeletal:        General: No deformity.     Cervical back: Normal range of motion and neck supple.  Skin:    General: Skin is warm and dry.  Neurological:     General: No focal deficit present.     Mental Status: She is alert.     Motor: No abnormal muscle tone.  Psychiatric:        Mood and Affect: Mood normal.        Behavior: Behavior normal.     ED Results / Procedures / Treatments   Labs (all labs ordered are listed, but only abnormal results are displayed) Labs Reviewed  COMPREHENSIVE METABOLIC PANEL - Abnormal; Notable for the following components:      Result Value   Potassium 2.8 (*)    Glucose, Bld 111 (*)    Total Protein 9.1 (*)    AST 103 (*)    ALT 91 (*)    Total Bilirubin 1.5 (*)    All other components within normal limits  CBC WITH DIFFERENTIAL/PLATELET - Abnormal;  Notable for the following components:   RBC 5.14 (*)    All other components within normal limits  URINALYSIS, ROUTINE W REFLEX MICROSCOPIC - Abnormal; Notable for the following components:  Color, Urine AMBER (*)    APPearance HAZY (*)    Ketones, ur 5 (*)    Protein, ur 100 (*)    Bacteria, UA RARE (*)    All other components within normal limits  LIPASE, BLOOD  MAGNESIUM  I-STAT BETA HCG BLOOD, ED ([facility], [facility], [facility] ONLY)    EKG None  Radiology No results found.  Procedures Procedures (including critical care time)  Medications Ordered in ED Medications  lactated ringers bolus 2,000 mL (0 mLs Intravenous Stopped 11/02/19 2113)  potassium chloride SA (KLOR-CON) CR tablet 40 mEq (40 mEq Oral Given 11/02/19 1840)  ondansetron (ZOFRAN) injection 4 mg (4 mg Intravenous Given 11/02/19 1840)    ED Course  I have reviewed the triage vital signs and the nursing notes.  Pertinent labs & imaging results that were available during my care of the patient were reviewed by me and considered in my medical decision making (see chart for details).  Clinical Course as of Nov 03 19  Thu Nov 02, 2019  2043 Patient reevaluated, she is sitting upright in bed eating graham crackers and peanut butter without distress.  She states that she feels fully back to normal.  She is requesting discharge home at this time.   [EH]    Clinical Course User Index [EH] Ollen Gross   MDM Rules/Calculators/A&P                     Patient is a 29 year old woman who presents today for evaluation of 4 days of nausea vomiting.  On exam her abdomen is soft nontender nondistended and she is generally well-appearing.  She denies any abdominal pain except for on the time when she is going to vomit.  Here she is afebrile, not consistently tachycardic or tachypneic.  She initially had evidence of dehydration with significant changes in her heart rate with position changes.  Labs obtained and reviewed, she is  hypokalemic  She is given p.o. potassium.  Magnesium normal.  She is given a prescription for continued potassium at home.  Suspect her hypokalemia is secondary to GI loss.  Her total bili, AST, ALT are minimally elevated, given that her abdomen is soft nontender nondistended and she passed p.o. challenge indication for additional testing at this time.  These results were discussed with patient.  After fluids and medications she was able to p.o. challenge without difficulty and reported feeling fully better.    Return precautions were discussed with patient who states their understanding.  At the time of discharge patient denied any unaddressed complaints or concerns.  Patient is agreeable for discharge home.  Note: Portions of this report may have been transcribed using voice recognition software. Every effort was made to ensure accuracy; however, inadvertent computerized transcription errors may be present  Final Clinical Impression(s) / ED Diagnoses Final diagnoses:  Dehydration  Gastroenteritis  Hypokalemia due to excessive gastrointestinal loss of potassium    Rx / DC Orders ED Discharge Orders         Ordered    potassium chloride 20 MEQ/15ML (10%) SOLN  Daily     11/02/19 2043    ondansetron (ZOFRAN ODT) 4 MG disintegrating tablet  Every 8 hours PRN     11/02/19 2043           Lorin Glass, PA-C 11/03/19 Sande Rives    Milton Ferguson, MD 11/04/19 [phone]

## 2019-11-02 NOTE — Discharge Instructions (Signed)
Today your potassium was low.  This is most likely due to your vomiting.  Please schedule a follow-up appointment with your primary care doctor.

## 2019-11-07 ENCOUNTER — Encounter (HOSPITAL_COMMUNITY): Payer: Self-pay | Admitting: Emergency Medicine

## 2019-11-07 ENCOUNTER — Other Ambulatory Visit: Payer: Self-pay

## 2019-11-07 ENCOUNTER — Emergency Department (HOSPITAL_COMMUNITY)
Admission: EM | Admit: 2019-11-07 | Discharge: 2019-11-07 | Disposition: A | Discharge status: Home / Self Care | Payer: Medicaid Other | Attending: Emergency Medicine | Admitting: Emergency Medicine

## 2019-11-07 DIAGNOSIS — F17290 Nicotine dependence, other tobacco product, uncomplicated: Secondary | ICD-10-CM | POA: Diagnosis not present

## 2019-11-07 DIAGNOSIS — Z79899 Other long term (current) drug therapy: Secondary | ICD-10-CM | POA: Diagnosis not present

## 2019-11-07 DIAGNOSIS — F909 Attention-deficit hyperactivity disorder, unspecified type: Secondary | ICD-10-CM | POA: Insufficient documentation

## 2019-11-07 DIAGNOSIS — R109 Unspecified abdominal pain: Secondary | ICD-10-CM | POA: Diagnosis not present

## 2019-11-07 DIAGNOSIS — R112 Nausea with vomiting, unspecified: Secondary | ICD-10-CM | POA: Insufficient documentation

## 2019-11-07 DIAGNOSIS — R110 Nausea: Secondary | ICD-10-CM

## 2019-11-07 LAB — CBC WITH DIFFERENTIAL/PLATELET
Abs Immature Granulocytes: 0.02 10*3/uL (ref 0.00–0.07)
Basophils Absolute: 0 10*3/uL (ref 0.0–0.1)
Basophils Relative: 0 %
Eosinophils Absolute: 0 10*3/uL (ref 0.0–0.5)
Eosinophils Relative: 0 %
HCT: 40.7 % (ref 36.0–46.0)
Hemoglobin: 13.4 g/dL (ref 12.0–15.0)
Immature Granulocytes: 0 %
Lymphocytes Relative: 18 %
Lymphs Abs: 1.6 10*3/uL (ref 0.7–4.0)
MCH: 28.6 pg (ref 26.0–34.0)
MCHC: 32.9 g/dL (ref 30.0–36.0)
MCV: 86.8 fL (ref 80.0–100.0)
Monocytes Absolute: 1.1 10*3/uL — ABNORMAL HIGH (ref 0.1–1.0)
Monocytes Relative: 12 %
Neutro Abs: 6.3 10*3/uL (ref 1.7–7.7)
Neutrophils Relative %: 70 %
Platelets: 221 10*3/uL (ref 150–400)
RBC: 4.69 MIL/uL (ref 3.87–5.11)
RDW: 12.3 % (ref 11.5–15.5)
WBC: 9 10*3/uL (ref 4.0–10.5)
nRBC: 0 % (ref 0.0–0.2)

## 2019-11-07 LAB — COMPREHENSIVE METABOLIC PANEL
ALT: 38 U/L (ref 0–44)
AST: 23 U/L (ref 15–41)
Albumin: 4.4 g/dL (ref 3.5–5.0)
Alkaline Phosphatase: 61 U/L (ref 38–126)
Anion gap: 10 (ref 5–15)
BUN: 11 mg/dL (ref 6–20)
CO2: 25 mmol/L (ref 22–32)
Calcium: 9.4 mg/dL (ref 8.9–10.3)
Chloride: 100 mmol/L (ref 98–111)
Creatinine, Ser: 0.64 mg/dL (ref 0.44–1.00)
GFR calc Af Amer: >60 mL/min (ref >60)
GFR calc non Af Amer: >60 mL/min (ref >60)
Glucose, Bld: 112 mg/dL — ABNORMAL HIGH (ref 70–99)
Potassium: 2.8 mmol/L — ABNORMAL LOW (ref 3.5–5.1)
Sodium: 135 mmol/L (ref 135–145)
Total Bilirubin: 0.6 mg/dL (ref 0.3–1.2)
Total Protein: 7.8 g/dL (ref 6.5–8.1)

## 2019-11-07 LAB — RAPID URINE DRUG SCREEN, HOSP PERFORMED
Amphetamines: NOT DETECTED
Barbiturates: NOT DETECTED
Benzodiazepines: NOT DETECTED
Cocaine: NOT DETECTED
Opiates: NOT DETECTED
Tetrahydrocannabinol: POSITIVE — AB

## 2019-11-07 LAB — URINALYSIS, ROUTINE W REFLEX MICROSCOPIC
Bilirubin Urine: NEGATIVE
Glucose, UA: NEGATIVE mg/dL
Hgb urine dipstick: NEGATIVE
Ketones, ur: NEGATIVE mg/dL
Leukocytes,Ua: NEGATIVE
Nitrite: NEGATIVE
Protein, ur: NEGATIVE mg/dL
Specific Gravity, Urine: 1.027 (ref 1.005–1.030)
pH: 6 (ref 5.0–8.0)

## 2019-11-07 LAB — LIPASE, BLOOD: Lipase: 49 U/L (ref 11–51)

## 2019-11-07 LAB — PREGNANCY, URINE: Preg Test, Ur: NEGATIVE

## 2019-11-07 MED ORDER — PROMETHAZINE HCL 25 MG/ML IJ SOLN
25.0000 mg | Freq: Once | INTRAMUSCULAR | Status: AC
  Administered 2019-11-07: 25 mg via INTRAVENOUS
  Filled 2019-11-07: qty 1

## 2019-11-07 MED ORDER — SODIUM CHLORIDE 0.9 % IV BOLUS (SEPSIS)
1000.0000 mL | Freq: Once | INTRAVENOUS | Status: AC
  Administered 2019-11-07: 1000 mL via INTRAVENOUS

## 2019-11-07 MED ORDER — POTASSIUM CHLORIDE 20 MEQ PO PACK
40.0000 meq | PACK | Freq: Once | ORAL | Status: AC
  Administered 2019-11-07: 40 meq via ORAL
  Filled 2019-11-07: qty 2

## 2019-11-07 NOTE — ED Triage Notes (Signed)
Pt having n/v since Last Sunday.  Seen at her pcp and given rx for phenergan, but pt did not pick up yet.  Pt states she thinks she needs some IV fluids.  Had orders for blood work but did not go get it done.

## 2019-11-07 NOTE — ED Provider Notes (Signed)
[facility] EMERGENCY DEPARTMENT Provider Note   CSN: [id number] Arrival date & time: 11/07/19  1356     History Chief Complaint  Patient presents with  . Nausea    Karina Palmer is a 29 y.o. female.  Patient is a 29 year old female with past medical history of ADHD, bipolar disorder presenting to the emergency department for nausea vomiting and abdominal pain.  Patient reports that this has been going on for about 1 week.  She did have a visit here to the ED and was improved with IV fluids and Zofran.  She had a low potassium and mildly elevated LFTs at that time.  Reports that she continued to have nausea and vomiting daily so she saw her primary medical doctor today who switched her prescription to Phenergan which she did not yet pick up from the pharmacy.  Reports that she began to have increased nausea vomiting so she returned to the ED where "I can get some relief".  Reports that she occasionally feels upper epigastric abdominal pain prior to the nausea and vomiting.  Reports she previously smoked and drank alcohol occasionally prior to these symptoms but have not since.  She denies any dysuria, vaginal bleeding, vaginal discharge, diarrhea.  No exacerbating or relieving factors.        Past Medical History:  Diagnosis Date  . ADHD (attention deficit hyperactivity disorder)   . Bipolar 1 disorder (HCC)   . Insomnia   . Menstrual cramps 07/09/2014  . Menstrual extraction 07/06/2013    Patient Active Problem List   Diagnosis Date Noted  . Encounter for gynecological examination with Papanicolaou smear of cervix 10/19/2019  . Dysmenorrhea 09/18/2019  . Encounter for menstrual regulation 09/18/2019  . Menstrual cramps 07/09/2014  . Menstrual extraction 07/06/2013    History reviewed. No pertinent surgical history.   OB History    Gravida  0   Para  0   Term  0   Preterm  0   AB  0   Living  0     SAB  0   TAB  0   Ectopic  0   Multiple  0   Live  Births              Family History  Problem Relation Age of Onset  . Other Mother        problems with ovaries    Social History   Tobacco Use  . Smoking status: Current Some Day Smoker    Packs/day: 0.00    Years: 10.00    Pack years: 0.00    Types: Cigars    Last attempt to quit: 06/17/2016    Years since quitting: 3.3  . Smokeless tobacco: Never Used  . Tobacco comment: smokes 2 cigars daily  Substance Use Topics  . Alcohol use: No  . Drug use: No    Home Medications Prior to Admission medications   Medication Sig Start Date End Date Taking? Authorizing Provider  beclomethasone (QVAR) 80 MCG/ACT inhaler Inhale 2 puffs into the lungs daily as needed (for shortness of breath/wheezing).    Yes [provider]  lamoTRIgine (LAMICTAL) 25 MG tablet Take 25 mg by mouth 2 (two) times daily.    Yes [provider]  megestrol (MEGACE) 40 MG tablet TAKE 1 daily Patient taking differently: Take 40 mg by mouth daily. TAKE 1 daily 10/19/19  Yes Cyril Mourning A, NP  potassium chloride 20 MEQ/15ML (10%) SOLN Take 15 mLs (20 mEq total)  by mouth daily for 5 days. 11/02/19 11/07/19 Yes Cristina Gong, PA-C  zolpidem (AMBIEN) 10 MG tablet Take 10 mg by mouth at bedtime.    Yes [provider]  ondansetron (ZOFRAN ODT) 4 MG disintegrating tablet Take 1 tablet (4 mg total) by mouth every 8 (eight) hours as needed for nausea or vomiting. Patient not taking: Reported on 11/07/2019 11/02/19   Cristina Gong, PA-C    Allergies    Patient has no known allergies.  Review of Systems   Review of Systems  Constitutional: Negative for chills, fatigue and fever.  HENT: Negative for congestion and sore throat.   Respiratory: Negative for cough and shortness of breath.   Cardiovascular: Negative for chest pain.  Gastrointestinal: Positive for abdominal pain, nausea and vomiting. Negative for diarrhea.  Genitourinary: Negative for decreased urine volume,  dysuria, vaginal bleeding and vaginal discharge.  Musculoskeletal: Negative for arthralgias and back pain.  Skin: Negative for rash.  Allergic/Immunologic: Negative for immunocompromised state.  Neurological: Negative for dizziness, light-headedness and headaches.  Hematological: Does not bruise/bleed easily.    Physical Exam Updated Vital Signs BP (!) 179/100 (BP Location: Right Arm)   Pulse 83   Temp 98.3 F (36.8 C) (Oral)   Resp 18   Ht 5\' 5"  (1.651 m)   Wt 46.7 kg   SpO2 100%   BMI 17.14 kg/m   Physical Exam Vitals and nursing note reviewed.  Constitutional:      General: She is not in acute distress.    Appearance: Normal appearance. She is not ill-appearing, toxic-appearing or diaphoretic.  HENT:     Head: Normocephalic.     Mouth/Throat:     Mouth: Mucous membranes are moist.  Eyes:     Conjunctiva/sclera: Conjunctivae normal.  Cardiovascular:     Rate and Rhythm: Normal rate and regular rhythm.  Pulmonary:     Effort: Pulmonary effort is normal.  Abdominal:     General: Abdomen is flat.     Palpations: Abdomen is soft.     Tenderness: There is abdominal tenderness (epigastric).  Skin:    General: Skin is warm and dry.  Neurological:     General: No focal deficit present.     Mental Status: She is alert.  Psychiatric:        Mood and Affect: Mood normal.     ED Results / Procedures / Treatments   Labs (all labs ordered are listed, but only abnormal results are displayed) Labs Reviewed  CBC WITH DIFFERENTIAL/PLATELET - Abnormal; Notable for the following components:      Result Value   Monocytes Absolute 1.1 (*)    All other components within normal limits  COMPREHENSIVE METABOLIC PANEL - Abnormal; Notable for the following components:   Potassium 2.8 (*)    Glucose, Bld 112 (*)    All other components within normal limits  URINALYSIS, ROUTINE W REFLEX MICROSCOPIC - Abnormal; Notable for the following components:   APPearance HAZY (*)    All  other components within normal limits  RAPID URINE DRUG SCREEN, HOSP PERFORMED - Abnormal; Notable for the following components:   Tetrahydrocannabinol POSITIVE (*)    All other components within normal limits  LIPASE, BLOOD  PREGNANCY, URINE    EKG None  Radiology No results found.  Procedures Procedures (including critical care time)  Medications Ordered in ED Medications  sodium chloride 0.9 % bolus 1,000 mL (0 mLs Intravenous Stopped 11/07/19 1549)  promethazine (PHENERGAN) injection 25 mg (25 mg  Intravenous Given 11/07/19 1444)  potassium chloride (KLOR-CON) packet 40 mEq (40 mEq Oral Given 11/07/19 1632)    ED Course  I have reviewed the triage vital signs and the nursing notes.  Pertinent labs & imaging results that were available during my care of the patient were reviewed by me and considered in my medical decision making (see chart for details).  Clinical Course as of Nov 07 2106  Tue Nov 07, 2019  1700 Patient with symptoms consistent with viral gastroenteritis vs thc hyperemesis.  Vitals are stable, no fever.  No signs of dehydration, tolerating PO fluids > 6 oz.  Lungs are clear.  No focal abdominal pain, no concern for appendicitis, cholecystitis, pancreatitis, ruptured viscus, UTI, kidney stone, or any other abdominal etiology.  Supportive therapy indicated with return if symptoms worsen.  Patient counseled. PO potassium and phenergan already prescribed by PMD    [KM]    Clinical Course User Index [KM] Kristine Royal   MDM Rules/Calculators/A&P                      Based on review of vitals, medical screening exam, lab work and/or imaging, there does not appear to be an acute, emergent etiology for the patient's symptoms. Counseled pt on good return precautions and encouraged both PCP and ED follow-up as needed.  Prior to discharge, I also discussed incidental imaging findings with patient in detail and advised appropriate, recommended follow-up in  detail.  Clinical Impression: 1. Nausea     Disposition: Discharge  Prior to providing a prescription for a controlled substance, I independently reviewed the patient's recent prescription history on the [facility]. The patient had no recent or regular prescriptions and was deemed appropriate for a brief, less than 3 day prescription of narcotic for acute analgesia.  This note was prepared with assistance of Systems analyst. Occasional wrong-word or sound-a-like substitutions may have occurred due to the inherent limitations of voice recognition software.  Final Clinical Impression(s) / ED Diagnoses Final diagnoses:  Nausea    Rx / DC Orders ED Discharge Orders    None       Kristine Royal 11/07/19 2108    Milton Ferguson, MD 11/08/19 [phone]

## 2019-11-07 NOTE — Discharge Instructions (Signed)
Thank you for allowing me to care for you today. Please return to the emergency department if you have new or worsening symptoms. Take your medications as instructed.  ° °

## 2020-12-05 ENCOUNTER — Telehealth: Payer: Self-pay

## 2020-12-05 NOTE — Telephone Encounter (Signed)
Left message @ 4:55 pm. JSY 

## 2020-12-05 NOTE — Telephone Encounter (Signed)
Pt called nurse line to report the meds she's requesting refill on is  Megesrol 40mg  tablets

## 2020-12-06 MED ORDER — MEGESTROL ACETATE 40 MG PO TABS: ORAL_TABLET | ORAL | 12 refills | Status: DC

## 2020-12-06 NOTE — Telephone Encounter (Signed)
Refilled megace  

## 2020-12-06 NOTE — Telephone Encounter (Signed)
Pt is requesting a refill on Megace. Thanks!! JSY ?

## 2021-03-04 ENCOUNTER — Emergency Department (HOSPITAL_COMMUNITY): Payer: Medicaid Other

## 2021-03-04 ENCOUNTER — Encounter (HOSPITAL_COMMUNITY): Payer: Self-pay

## 2021-03-04 ENCOUNTER — Emergency Department (HOSPITAL_COMMUNITY)
Admission: EM | Admit: 2021-03-04 | Discharge: 2021-03-04 | Disposition: A | Discharge status: Home / Self Care | Payer: Medicaid Other | Attending: Emergency Medicine | Admitting: Emergency Medicine

## 2021-03-04 ENCOUNTER — Other Ambulatory Visit: Payer: Self-pay

## 2021-03-04 DIAGNOSIS — M25461 Effusion, right knee: Secondary | ICD-10-CM | POA: Diagnosis not present

## 2021-03-04 DIAGNOSIS — M25561 Pain in right knee: Secondary | ICD-10-CM | POA: Diagnosis not present

## 2021-03-04 DIAGNOSIS — F17210 Nicotine dependence, cigarettes, uncomplicated: Secondary | ICD-10-CM | POA: Insufficient documentation

## 2021-03-04 MED ORDER — IBUPROFEN 800 MG PO TABS
800.0000 mg | ORAL_TABLET | Freq: Three times a day (TID) | ORAL | 0 refills | Status: DC
Start: 2021-03-04 — End: 2022-09-29

## 2021-03-04 NOTE — ED Provider Notes (Signed)
[facility] EMERGENCY DEPARTMENT Provider Note   CSN: [id number] Arrival date & time: 03/04/21  8182     History Chief Complaint  Patient presents with   Knee Pain    Karina Palmer is a 30 y.o. female.   Knee Pain Associated symptoms: no back pain and no fever        Karina Palmer is a 30 y.o. female who presents to the Emergency Department complaining of pain and swelling of her right knee since yesterday.  She states that she kicked in a door with her right knee and felt something "pop."  She reports noticing gradually increasing swelling to her entire knee.  Pain is associated with weightbearing and with extension.  Pain is improved with knee held in flexion.  He has applied ice packs intermittently with some relief.  She is able to bear weight on the knee but states that it is painful.  No history of prior injuries of the right knee.    Past Medical History:  Diagnosis Date   ADHD (attention deficit hyperactivity disorder)    Bipolar 1 disorder (HCC)    Insomnia    Menstrual cramps 07/09/2014   Menstrual extraction 07/06/2013    Patient Active Problem List   Diagnosis Date Noted   Encounter for gynecological examination with Papanicolaou smear of cervix 10/19/2019   Dysmenorrhea 09/18/2019   Encounter for menstrual regulation 09/18/2019   Menstrual cramps 07/09/2014   Menstrual extraction 07/06/2013    History reviewed. No pertinent surgical history.   OB History     Gravida  0   Para  0   Term  0   Preterm  0   AB  0   Living  0      SAB  0   IAB  0   Ectopic  0   Multiple  0   Live Births              Family History  Problem Relation Age of Onset   Other Mother        problems with ovaries    Social History   Tobacco Use   Smoking status: Some Days    Packs/day: 0.00    Years: 10.00    Pack years: 0.00    Types: Cigars, Cigarettes    Last attempt to quit: 06/17/2016    Years since quitting: 4.7   Smokeless  tobacco: Never   Tobacco comments:    smokes 2 cigars daily  Vaping Use   Vaping Use: Never used  Substance Use Topics   Alcohol use: Yes    Comment: occ   Drug use: No    Home Medications Prior to Admission medications   Medication Sig Start Date End Date Taking? Authorizing Provider  ibuprofen (ADVIL) 800 MG tablet Take 1 tablet (800 mg total) by mouth 3 (three) times daily. Take with food 03/04/21  Yes Juell Radney, PA-C  megestrol (MEGACE) 40 MG tablet TAKE 1 daily Patient taking differently: Take 40 mg by mouth daily. TAKE 1 daily 12/06/20  Yes Cyril Mourning A, NP  zolpidem (AMBIEN) 10 MG tablet Take 10 mg by mouth at bedtime.   Yes [provider]  beclomethasone (QVAR) 80 MCG/ACT inhaler Inhale 2 puffs into the lungs daily as needed (for shortness of breath/wheezing).  Patient not taking: Reported on 03/04/2021    [provider]  ondansetron (ZOFRAN ODT) 4 MG disintegrating tablet Take 1 tablet (4 mg total) by mouth every 8 (  eight) hours as needed for nausea or vomiting. Patient not taking: No sig reported 11/02/19   Cristina Gong, PA-C  potassium chloride 20 MEQ/15ML (10%) SOLN Take 15 mLs (20 mEq total) by mouth daily for 5 days. 11/02/19 11/07/19  Cristina Gong, PA-C    Allergies    Patient has no known allergies.  Review of Systems   Review of Systems  Constitutional:  Negative for chills and fever.  Gastrointestinal:  Negative for nausea and vomiting.  Musculoskeletal:  Positive for arthralgias (Right knee pain and swelling). Negative for back pain and joint swelling.  Skin:  Negative for color change and wound.  Neurological:  Negative for weakness and numbness.   Physical Exam Updated Vital Signs BP (!) 148/101 (BP Location: Right Arm)   Pulse 70   Temp 98 F (36.7 C) (Oral)   Resp 16   Ht 5\' 5"  (1.651 m)   Wt 49 kg   SpO2 99%   BMI 17.97 kg/m   Physical Exam Constitutional:      General: She is not in acute  distress.    Appearance: Normal appearance. She is not ill-appearing.  HENT:     Head: Atraumatic.  Cardiovascular:     Rate and Rhythm: Normal rate and regular rhythm.     Pulses: Normal pulses.  Pulmonary:     Effort: Pulmonary effort is normal.     Breath sounds: Normal breath sounds.  Musculoskeletal:        General: Tenderness and signs of injury present. Normal range of motion.     Right lower leg: No edema.     Left lower leg: No edema.  Skin:    General: Skin is warm.     Capillary Refill: Capillary refill takes less than 2 seconds.     Findings: No bruising, erythema or rash.  Neurological:     General: No focal deficit present.     Mental Status: She is alert.     Sensory: No sensory deficit.     Motor: No weakness.    ED Results / Procedures / Treatments   Labs (all labs ordered are listed, but only abnormal results are displayed) Labs Reviewed - No data to display  EKG None  Radiology DG Knee Complete 4 Views Right  Result Date: 03/04/2021 CLINICAL DATA:  Pain, felt popping sensation EXAM: RIGHT KNEE - COMPLETE 4+ VIEW COMPARISON:  None. FINDINGS: No fracture or dislocation of the right knee. The joint spaces are well preserved. There is a moderate, nonspecific knee joint effusion. Soft tissues are unremarkable. IMPRESSION: 1. No fracture or dislocation of the right knee. The joint spaces are well preserved. 2.  There is a moderate, nonspecific knee joint effusion. Electronically Signed   By: 03/06/2021 M.D.   On: 03/04/2021 08:20    Procedures Procedures   Medications Ordered in ED Medications - No data to display  ED Course  I have reviewed the triage vital signs and the nursing notes.  Pertinent labs & imaging results that were available during my care of the patient were reviewed by me and considered in my medical decision making (see chart for details).    MDM Rules/Calculators/A&P                           Patient here for evaluation of right  knee pain and swelling secondary to kicking on a door with her knee yesterday.  Minimal relief of symptoms  with application of ice.  X-rays show no fracture or dislocation.  There is a nonspecific knee joint effusion that is felt to be secondary to contusion.  Patient agrees to RICE therapy and close orthopedic follow-up.  Will provide knee brace and crutches along with prescription for anti-inflammatory.  Appears appropriate for discharge home, all questions were answered   Final Clinical Impression(s) / ED Diagnoses Final diagnoses:  Acute pain of right knee    Rx / DC Orders ED Discharge Orders          Ordered    ibuprofen (ADVIL) 800 MG tablet  3 times daily        03/04/21 0930             Pauline Aus, PA-C 03/04/21 1242    Bethann Berkshire, MD 03/06/21 [phone]

## 2021-03-04 NOTE — Discharge Instructions (Addendum)
Apply ice packs on/off to your knee.  Use the crutches for weight bearing.  Call the orthropedic listed to arrange a follow-up appt if not improving.

## 2021-03-04 NOTE — ED Triage Notes (Signed)
Pt presents to ED with complaints of right knee pain after kicking the door yesterday. Pt states she felt something pop.

## 2021-03-12 ENCOUNTER — Encounter: Payer: Self-pay | Admitting: Orthopedic Surgery

## 2021-03-12 ENCOUNTER — Other Ambulatory Visit: Payer: Self-pay

## 2021-03-12 ENCOUNTER — Ambulatory Visit: Payer: Medicaid Other | Admitting: Orthopedic Surgery

## 2021-03-12 ENCOUNTER — Ambulatory Visit: Payer: Medicaid Other

## 2021-03-12 VITALS — BP 100/57 | HR 82 | Ht 66.0 in | Wt 113.0 lb

## 2021-03-12 DIAGNOSIS — S62614A Displaced fracture of proximal phalanx of right ring finger, initial encounter for closed fracture: Secondary | ICD-10-CM | POA: Diagnosis not present

## 2021-03-12 DIAGNOSIS — M25561 Pain in right knee: Secondary | ICD-10-CM | POA: Diagnosis not present

## 2021-03-12 DIAGNOSIS — M79641 Pain in right hand: Secondary | ICD-10-CM

## 2021-03-12 NOTE — Addendum Note (Signed)
Addended by: Cherre Huger E on: 03/12/2021 11:54 AM   Modules accepted: Orders

## 2021-03-12 NOTE — Patient Instructions (Signed)
We work on getting an MRI scheduled for your right knee.    General Cast Instructions  1.  You were placed in a cast in clinic today.  Please keep the cast material clean, dry and intact.  Please do not use anything to itch the under the cast.  If it gets itchy, you can consider taking benadryl, or similar medication.  If the cast material gets wet, place it on a towel and use a hair dryer on a low setting. 2.  Tylenol or Ibuprofen/Naproxen as needed.   3.  Recommend elevating your extremity as much as possible to help with swelling. 4.  F/u 2 weeks, cast off and repeat XR

## 2021-03-12 NOTE — Progress Notes (Signed)
New Patient Visit  Assessment: Karina Palmer is a 30 y.o. female with the following: 1. Closed displaced fracture of proximal phalanx of right ring finger, initial encounter 2. Acute pain of right knee; likely meniscus injury, possible bucket handle tear restricting motion  Plan: Right ring finger proximal phalanx fracture, minimally displaced.  No rotational deformity.  Plan to treat without surgery.  She was placed in a well padded ulnar gutter cast.  The ring and long finger were buddy taped and incorporated into the cast.  Follow up in 2 weeks for cast removal and repeat evaluation.   Regarding her right knee.  She sustained a twisting injury approximately one week ago. She has continued pain and persistent swelling.  She is unable to achieve full extension of the knee due to pain and stiffness.  Pain is worsened with flexion.  She notes a pinching sensation with flexion beyond 90 degrees.  Positive MacMurray's.  Based on the description of her injury and presentation on physical exam, I am concerned about a mensicus tear, including a possible bucket handle meniscus tear.  I would like to obtain an urgent MRI of the right knee.  We will start to work on insurance authorization immediately.   Cast application - right ulnar gutter cast   Verbal consent was obtained and the correct extremity was identified. A well padded, appropriately molded ulnar gutter cast was applied to the right arm, including the long finger.  Fingers remained warm and well perfused.   There were no sharp edges Patient tolerated the procedure well Cast care instructions were provided     Follow-up: Return in about 2 weeks (around 03/26/2021).  Subjective:  Chief Complaint  Patient presents with   Hand Pain    Right, injury hit wall 8/17   Knee Pain    Right, injury fell 03/04/21    History of Present Illness: Karina Palmer is a 30 y.o. female who presents for evaluation of right knee and hand pain.   She had 2 separate injuries.  She reports she kicked a door approximately one week ago, and felt a pop.  She noted immediate pain and swelling.  She presented to the ED for evaluation.  Swelling has improved a little bit, but at one point it was severe.  She has limited ability to walk due to pain.  Pain is worsened with full extension and flexion.  She has difficulty walking.  No prior injury.  Since this episode, she reports punching a wall and pain in her ring finger.  She has had difficulty using her right hand as a result.    Review of Systems: No fevers or chills No numbness or tingling No chest pain No shortness of breath No bowel or bladder dysfunction No GI distress No headaches   Medical History:  Past Medical History:  Diagnosis Date   ADHD (attention deficit hyperactivity disorder)    Bipolar 1 disorder (HCC)    Insomnia    Menstrual cramps 07/09/2014   Menstrual extraction 07/06/2013    History reviewed. No pertinent surgical history.  Family History  Problem Relation Age of Onset   Other Mother        problems with ovaries   Social History   Tobacco Use   Smoking status: Some Days    Packs/day: 0.00    Years: 10.00    Pack years: 0.00    Types: Cigars, Cigarettes    Last attempt to quit: 06/17/2016    Years since  quitting: 4.7   Smokeless tobacco: Never   Tobacco comments:    smokes 2 cigars daily  Vaping Use   Vaping Use: Never used  Substance Use Topics   Alcohol use: Yes    Comment: occ   Drug use: No    No Known Allergies  Current Meds  Medication Sig   beclomethasone (QVAR) 80 MCG/ACT inhaler Inhale 2 puffs into the lungs daily as needed (for shortness of breath/wheezing).   ibuprofen (ADVIL) 800 MG tablet Take 1 tablet (800 mg total) by mouth 3 (three) times daily. Take with food   megestrol (MEGACE) 40 MG tablet TAKE 1 daily (Patient taking differently: Take 40 mg by mouth daily. TAKE 1 daily)    Objective: BP (!) 100/57   Pulse 82    Ht 5\' 6"  (1.676 m)   Wt 113 lb (51.3 kg)   BMI 18.24 kg/m   Physical Exam:  General: Alert and oriented. and No acute distress. Gait: Right sided antalgic gait.  Evaluation of the right hand demonstrates swelling of the ring finger.  Pain with movement of her ring finger.  Tenderness at the base of the finger.  Fingers are warm and well perfused.  No rotational deformity.  Fingers are warm and well perfused.  Evaluation of the right knee demonstrates a moderate effusion.  Unable to achieve full extension due to pain.  Pain with flexion beyond 90 degrees.  Tenderness to palpation over the lateral joint line.  Negative Lachman.  Positive MacMurray's test.  No increased laxity to varus or valgus stress.  Active motion intact distally.  Sensation intact.   IMAGING: I personally ordered and reviewed the following images  XR of the right ring finger demonstrates a proximal phalanx fracture, at the base.  No intra-articular involvement.  Minimally displaced.  No obvious rotational deformity.  Minimally angulated on lateral image.  Impression: minimally displaced fracture at the base of the proximal phalanx of the right ring finger.    New Medications:  No orders of the defined types were placed in this encounter.     , MD  03/12/2021 11:30 AM

## 2021-03-17 ENCOUNTER — Other Ambulatory Visit: Payer: Self-pay

## 2021-03-17 ENCOUNTER — Ambulatory Visit (INDEPENDENT_AMBULATORY_CARE_PROVIDER_SITE_OTHER): Payer: Medicaid Other | Admitting: Orthopedic Surgery

## 2021-03-17 ENCOUNTER — Encounter: Payer: Self-pay | Admitting: Orthopedic Surgery

## 2021-03-17 ENCOUNTER — Telehealth: Payer: Self-pay

## 2021-03-17 DIAGNOSIS — S62614A Displaced fracture of proximal phalanx of right ring finger, initial encounter for closed fracture: Secondary | ICD-10-CM

## 2021-03-17 NOTE — Telephone Encounter (Signed)
Tell her to come in for me to look at the cast Anytime there is a cast problem, we need to at least look at it, regardless of who is here, thanks.

## 2021-03-17 NOTE — Telephone Encounter (Signed)
Since Dr. Dallas Schimke is in this afternoon, I put her on his schedule to have her cast issue checked out.  She was very grateful.

## 2021-03-17 NOTE — Telephone Encounter (Signed)
This is a Dr. Dallas Schimke' patient with a question about her cast. States that she is having pain going from her wrist up to her forearm. I explained that Dr. Dallas Schimke and his nurse both are not in the office this morning, but I would see if you would give her a call after clinic this morning and discuss her issue. Her number is [phone].

## 2021-03-17 NOTE — Progress Notes (Signed)
Orthopaedic Clinic Return  Assessment: Karina Palmer is a 30 y.o. female with the following: Right ring finger, proximal phalanx fracture Right knee pain; possible meniscus injury  Plan: Patient presented for essentially a cast check.  At time of appointment, pain is improved.  Cast is intact, no areas of concern.  Patient ok with leaving cast in place.  Medications as needed.  Elevate hand as much as possible.  All questions answered.    Still awaiting scheduling of MRI for right knee.   Follow up 1 week  Follow-up: Return in about 1 week (around 03/24/2021).   Subjective:  Chief Complaint  Patient presents with   Hand Pain    In ulna gutter cast. Patient says arm is painful in the middle of the arm inside the cast. States pain was sharp and radiated up the arm    History of Present Illness: Karina Palmer is a 31 y.o. female who returns to clinic for repeat evaluation of her right ring finger pain.  Proximal phalanx fracture, treated in a cast.  Woke up this morning with acute worsening of pain.  Concerned for cast causing issues.  Took some tylenol and pain improved.  Reports some itching under cast.   States pain is in volar forearm, radiates proximally. Still has not had MRI of right knee scheduled. Difficulty ambulating.   Review of Systems: No fevers or chills No numbness or tingling No chest pain No shortness of breath No bowel or bladder dysfunction No GI distress No headaches   Objective: There were no vitals taken for this visit.  Physical Exam:  Alert and oriented.  No acute distress  Cast is clean, dry and intact.  No skin issues proximally.  No obvious skin irritation.  Exposed fingers are warm and well perfused.   IMAGING: I personally ordered and reviewed the following images:   No new imaging obtained today.   Oliver Barre, MD 03/17/2021 11:45 PM

## 2021-03-26 ENCOUNTER — Ambulatory Visit: Payer: Medicaid Other | Admitting: Orthopedic Surgery

## 2021-03-26 ENCOUNTER — Other Ambulatory Visit: Payer: Self-pay

## 2021-03-26 ENCOUNTER — Encounter: Payer: Self-pay | Admitting: Orthopedic Surgery

## 2021-03-26 ENCOUNTER — Ambulatory Visit: Payer: Medicaid Other

## 2021-03-26 VITALS — Ht 66.0 in | Wt 113.0 lb

## 2021-03-26 DIAGNOSIS — S62614D Displaced fracture of proximal phalanx of right ring finger, subsequent encounter for fracture with routine healing: Secondary | ICD-10-CM

## 2021-03-26 NOTE — Patient Instructions (Signed)

## 2021-03-26 NOTE — Progress Notes (Signed)
Orthopaedic Clinic Return  Assessment: Karina Palmer is a 30 y.o. female with the following: Right ring finger, proximal phalanx fracture Right knee pain; possible meniscus injury  Plan: Patient continues to do well.  She does have some residual pain and stiffness in the right ring finger.  Radiographs today demonstrate stable alignment overall.  No obvious rotational deformity.  Injury happened approximately 3 weeks ago, we will continue with an additional 2 weeks of immobilization.  Monitoring fingers were buddy taped together, and an ulnar gutter cast was placed.  Still awaiting scheduling of MRI for right knee.   Cast application - right ulnar gutter cast   Verbal consent was obtained and the correct extremity was identified. A well padded, appropriately molded ulnar gutter cast was applied to the right arm, including the long finger.  Fingers remained warm and well perfused.   There were no sharp edges Patient tolerated the procedure well Cast care instructions were provided      Follow-up: Return in about 2 weeks (around 04/09/2021).   Subjective:  Chief Complaint  Patient presents with   Fracture    Rt hand ring finger . Pt has cut off a major portion of her cast.    History of Present Illness: Karina Palmer is a 30 y.o. female who returns to clinic for repeat evaluation of her right ring finger pain.  She has a right ring finger, proximal phalanx fracture.  She is tolerating the cast, although it was modified, and the more proximal extent of the cast was cut by the patient.  Her pain is improving.  She has occasional shooting pains.  Regarding her right knee, she continues to have pain and catching sensations, particularly within the lateral aspect of her knee.  She does feel as though the swelling and overall pain is improving.  She is still waiting for her right knee MRI.  Review of Systems: No fevers or chills No numbness or tingling No chest pain No  shortness of breath No bowel or bladder dysfunction No GI distress No headaches   Objective: Ht 5\' 6"  (1.676 m)   Wt 113 lb (51.3 kg)   BMI 18.24 kg/m   Physical Exam:  Alert and oriented.  No acute distress  Evaluation of finger, after removal of the cast demonstrates no obvious skin breakdown.  Sensation is intact in the distal aspect of her fingers.  No obvious rotational deformity.  Passively, she has full flexion of the index, long and small finger.  Limited range of motion of the ring finger due to pain.  IMAGING: I personally ordered and reviewed the following images:   X-ray of the right ring finger was obtained in clinic today, compared to previous x-rays.  There has been no interval displacement of the fracture site.  At the base of the proximal phalanx, there is a comminuted fracture, with a fracture off the ulnar aspect of the base.  Overall alignment is essentially the same.  There has been some interval consolidation at the fracture site.  Impression: Healing, comminuted fracture of the base of the proximal phalanx of the right ring finger  , MD 03/26/2021 4:17 PM

## 2021-04-07 ENCOUNTER — Ambulatory Visit (HOSPITAL_COMMUNITY)
Admission: RE | Admit: 2021-04-07 | Discharge: 2021-04-07 | Disposition: A | Discharge status: Home / Self Care | Payer: Medicaid Other | Source: Ambulatory Visit | Attending: Orthopedic Surgery | Admitting: Orthopedic Surgery

## 2021-04-07 ENCOUNTER — Other Ambulatory Visit: Payer: Self-pay

## 2021-04-07 DIAGNOSIS — M25561 Pain in right knee: Secondary | ICD-10-CM | POA: Diagnosis not present

## 2021-04-08 ENCOUNTER — Telehealth: Payer: Self-pay | Admitting: Orthopedic Surgery

## 2021-04-08 NOTE — Telephone Encounter (Signed)
Patient called to relay that her cast has been bothering her;  said it is not wet. Aware of appointment tomorrow morning for fracture care check and review of MRI results. Per chart notes, MRI results are not yet received (patient had it done yesterday, 04/08/21, 2:00pm, at [facility]. Please advise if patient is to come today in any event.

## 2021-04-08 NOTE — Telephone Encounter (Signed)
LVM to see if pt can wait until tomorrow if not to call us back.

## 2021-04-09 ENCOUNTER — Other Ambulatory Visit: Payer: Self-pay

## 2021-04-09 ENCOUNTER — Ambulatory Visit: Payer: Medicaid Other

## 2021-04-09 ENCOUNTER — Ambulatory Visit (INDEPENDENT_AMBULATORY_CARE_PROVIDER_SITE_OTHER): Payer: Medicaid Other | Admitting: Orthopedic Surgery

## 2021-04-09 ENCOUNTER — Encounter: Payer: Self-pay | Admitting: Orthopedic Surgery

## 2021-04-09 VITALS — Ht 66.0 in | Wt 113.0 lb

## 2021-04-09 DIAGNOSIS — S62614D Displaced fracture of proximal phalanx of right ring finger, subsequent encounter for fracture with routine healing: Secondary | ICD-10-CM

## 2021-04-09 DIAGNOSIS — M25561 Pain in right knee: Secondary | ICD-10-CM

## 2021-04-09 DIAGNOSIS — S82141D Displaced bicondylar fracture of right tibia, subsequent encounter for closed fracture with routine healing: Secondary | ICD-10-CM | POA: Diagnosis not present

## 2021-04-09 NOTE — Progress Notes (Signed)
Orthopaedic Clinic Return  Assessment: Karina Palmer is a 30 y.o. female with the following: Right ring finger, proximal phalanx fracture Right knee pain; mildly displaced lateral tibial plateau fracture  Plan: Patient has obtained an MRI of her right knee.  This is demonstrated a mildly displaced fracture of the lateral tibial plateau.  Repeat radiographs today do not readily identify the fracture.  The injury was sustained approximately 5-6 weeks ago.  We will continue nonoperative management.  Anticipate that her pain and range of motion will continue to improve.  Nothing further needed for her knee at this time.  In regards to her right ring finger, she has been immobilized for a month.  Radiographs remained stable.  Her fingers are stiff, but this should improve.  I advised her to start working on range of motion of all fingers.  I gave her some exercises to focus on this.  I also demonstrated some stretching activities for her fingers.  If she is struggling to regain the range of motion in her fingers in 2 weeks, she should contact the clinic and we can place a referral for hand therapy.  Otherwise, I will see her back in 4 weeks for repeat evaluation.    Follow-up: Return in about 4 weeks (around 05/07/2021).   Subjective:  Chief Complaint  Patient presents with   Fracture    Rt ring finger DOI 03/03/21    History of Present Illness: Karina Palmer is a 30 y.o. female who returns to clinic for repeat evaluation of her right ring finger and right knee.  She has obtained an MRI.  She has been tolerating the cast on her right hand.  Pain is well controlled.  No numbness or tingling.  Pain is improving in her knee.  She continues to limp, but has excellent range of motion.    Review of Systems: No fevers or chills No numbness or tingling No chest pain No shortness of breath No bowel or bladder dysfunction No GI distress No headaches   Objective: Ht 5\' 6"  (1.676 m)    Wt 113 lb (51.3 kg)   BMI 18.24 kg/m   Physical Exam:  Alert and oriented.  No acute distress  Evaluation of the right hand demonstrates dry skin.  No swelling is appreciated.  Mild tenderness to palpation at the base of the proximal phalanx to the ring finger.  No tenderness to palpation at the MCP joint.  She is unable to flex her fingers.  She cannot make a fist.  Passive extension is full in all fingers.  Mild swelling at the base of her ring finger.  Evaluation of the right knee demonstrates no swelling.  Tenderness to palpation over the lateral tibial plateau.  There is no obvious laxity to varus stress at full extension, or 30 degrees of flexion.  Negative Lachman.  She is able to get to full extension.  Flexion to greater than 120 degrees.   IMAGING: I personally ordered and reviewed the following images:   X-ray of the right ring finger was obtained in clinic, compared to previous x-rays.  The fracture at the base of the proximal phalanx remained stable.  There has been interval consolidation.  No interval displacement.  Overall alignment remains unchanged.  No acute injuries are noted.  Impression: Right ring finger, proximal phalanx fracture in stable and acceptable position  X-ray of the right knee was obtained in clinic today and demonstrates neutral overall alignment.  There is no obvious depression  of the lateral tibial plateau on the AP view.  Mild step-off is appreciated on the lateral view.  No callus formation is identified.  No acute injuries.  Impression: Right lateral tibial plateau fracture with minimal displacement, in stable position.   Right knee MRI  IMPRESSION: 1. Acute mildly depressed lateral tibial plateau fracture. 2. Small amount of marrow edema at the far peripheral aspect of the medial tibial plateau, which may reflect a bone contusion. 3. Intact menisci and ligaments.    Oliver Barre, MD 04/09/2021 9:43 PM

## 2021-04-09 NOTE — Patient Instructions (Addendum)
Focus on range of motion of your fingers.  Gentle stretching is appropriate.  Long sustained stretches are most beneficial.  Avoid lifting and gripping for the next month  Work on range of motion of the right knee.

## 2021-05-07 ENCOUNTER — Ambulatory Visit: Payer: Medicaid Other | Admitting: Orthopedic Surgery

## 2021-05-09 ENCOUNTER — Ambulatory Visit: Payer: Medicaid Other

## 2021-05-09 ENCOUNTER — Ambulatory Visit (INDEPENDENT_AMBULATORY_CARE_PROVIDER_SITE_OTHER): Payer: Medicaid Other | Admitting: Orthopedic Surgery

## 2021-05-09 ENCOUNTER — Other Ambulatory Visit: Payer: Self-pay

## 2021-05-09 ENCOUNTER — Encounter: Payer: Self-pay | Admitting: Orthopedic Surgery

## 2021-05-09 DIAGNOSIS — M65341 Trigger finger, right ring finger: Secondary | ICD-10-CM | POA: Diagnosis not present

## 2021-05-09 DIAGNOSIS — S82141D Displaced bicondylar fracture of right tibia, subsequent encounter for closed fracture with routine healing: Secondary | ICD-10-CM

## 2021-05-09 DIAGNOSIS — S62614D Displaced fracture of proximal phalanx of right ring finger, subsequent encounter for fracture with routine healing: Secondary | ICD-10-CM | POA: Diagnosis not present

## 2021-05-09 NOTE — Progress Notes (Signed)
Orthopaedic Clinic Return  Assessment: Karina Palmer is a 30 y.o. female with the following: Right ring finger, proximal phalanx fracture Right knee pain; mildly displaced lateral tibial plateau fracture Right ring finger trigger finger  Plan: Patient's right knee is gradually improving.  She has good range of motion.  She continues to ambulate with a slightly antalgic gait, but this is improving.  Radiographs remained stable.  There is no obvious laxity in her knee.  Anticipate she will continue to improve.   Regarding her right hand, she continues to improve.  Radiographs are stable.  She has excellent range of motion of the right ring finger.  However, she does have some discomfort at the MCP joint.  This is generalized stiffness.  This will improve.  She is also developing a trigger finger of the right ring finger.  She has tenderness palpation over the A1 pulley.  I offered her an injection in clinic today, but she would like to see how well her symptoms improved.  I think this is reasonable.  If she wishes to get an injection in the future, I have asked her to contact the clinic.  Follow-up: Return if symptoms worsen or fail to improve.   Subjective:  Chief Complaint  Patient presents with   Knee Pain    RT knee   Hand Pain    RT ring finger    History of Present Illness: Karina Palmer is a 30 y.o. female who returns to clinic for repeat evaluation of her right ring finger and right knee.  Overall, her pain is improving.  She notes no swelling or pain in the right knee.  She has full range of motion.  She does still have some discomfort in the right ring finger, but she has excellent range of motion otherwise.  She notes a catching sensation in the right ring finger.  Occasionally, she will have to straighten her finger using some force.  This is been ongoing for the past couple weeks.  She does not take any medications on a regular basis.    Review of Systems: No  fevers or chills No numbness or tingling No chest pain No shortness of breath No bowel or bladder dysfunction No GI distress No headaches   Objective: There were no vitals taken for this visit.  Physical Exam:  Alert and oriented.  No acute distress  Evaluation of the right hand demonstrates no deformity.  No swelling is appreciated.  She has no bruising.  She is just short of making a full fist with the right hand.  She has no stiffness in the adjacent fingers.  Right ring finger with almost full flexion into her palm.  She has some tenderness to palpation over the A1 pulley.  No triggering is witnessed in clinic today.  Fingers are warm and well-perfused.  Evaluation of the right knee demonstrates no swelling.  Minimal tenderness to palpation over the lateral tibial plateau.  There is no obvious laxity to varus stress at full extension, or 30 degrees of flexion.  Negative Lachman.  She is able to get to full extension.  Flexion to greater than 120 degrees.   IMAGING: I personally ordered and reviewed the following images:  X-ray of the right ring finger was obtained in clinic today and demonstrates no further displacement of the fracture at the base of the proximal phalanx to the ring finger.  No obvious rotational deformity.  No acute injuries are noted.  Some callus formation is  visualized at the base of the ring finger.  Impression: Right ring finger, proximal phalanx fracture in stable position.   X-ray of the right knee was obtained in clinic today, compared to previous x-rays.  Neutral overall alignment.  No acute injuries are noted.  No effusion within the right knee.  There is no obvious depression along the lateral tibial plateau.  No callus formation.  Impression: Essentially nondisplaced lateral tibial plateau fracture, best visualized on the MRI.  Oliver Barre, MD 05/09/2021 11:35 AM

## 2021-05-21 ENCOUNTER — Other Ambulatory Visit: Payer: Self-pay

## 2021-05-21 ENCOUNTER — Emergency Department (HOSPITAL_COMMUNITY)
Admission: EM | Admit: 2021-05-21 | Discharge: 2021-05-21 | Disposition: A | Discharge status: Home / Self Care | Payer: Medicaid Other | Attending: Emergency Medicine | Admitting: Emergency Medicine

## 2021-05-21 ENCOUNTER — Encounter (HOSPITAL_COMMUNITY): Payer: Self-pay

## 2021-05-21 DIAGNOSIS — R1114 Bilious vomiting: Secondary | ICD-10-CM

## 2021-05-21 DIAGNOSIS — F17210 Nicotine dependence, cigarettes, uncomplicated: Secondary | ICD-10-CM | POA: Diagnosis not present

## 2021-05-21 DIAGNOSIS — N390 Urinary tract infection, site not specified: Secondary | ICD-10-CM | POA: Diagnosis not present

## 2021-05-21 DIAGNOSIS — E876 Hypokalemia: Secondary | ICD-10-CM

## 2021-05-21 DIAGNOSIS — R109 Unspecified abdominal pain: Secondary | ICD-10-CM | POA: Diagnosis present

## 2021-05-21 LAB — COMPREHENSIVE METABOLIC PANEL
ALT: 60 U/L — ABNORMAL HIGH (ref 0–44)
AST: 80 U/L — ABNORMAL HIGH (ref 15–41)
Albumin: 4.9 g/dL (ref 3.5–5.0)
Alkaline Phosphatase: 69 U/L (ref 38–126)
Anion gap: 11 (ref 5–15)
BUN: 14 mg/dL (ref 6–20)
CO2: 23 mmol/L (ref 22–32)
Calcium: 10 mg/dL (ref 8.9–10.3)
Chloride: 103 mmol/L (ref 98–111)
Creatinine, Ser: 0.61 mg/dL (ref 0.44–1.00)
GFR, Estimated: >60 mL/min (ref >60)
Glucose, Bld: 123 mg/dL — ABNORMAL HIGH (ref 70–99)
Potassium: 2.8 mmol/L — ABNORMAL LOW (ref 3.5–5.1)
Sodium: 137 mmol/L (ref 135–145)
Total Bilirubin: 1.4 mg/dL — ABNORMAL HIGH (ref 0.3–1.2)
Total Protein: 8.8 g/dL — ABNORMAL HIGH (ref 6.5–8.1)

## 2021-05-21 LAB — CBC WITH DIFFERENTIAL/PLATELET
Abs Immature Granulocytes: 0.02 10*3/uL (ref 0.00–0.07)
Basophils Absolute: 0 10*3/uL (ref 0.0–0.1)
Basophils Relative: 0 %
Eosinophils Absolute: 0 10*3/uL (ref 0.0–0.5)
Eosinophils Relative: 0 %
HCT: 45.1 % (ref 36.0–46.0)
Hemoglobin: 15.3 g/dL — ABNORMAL HIGH (ref 12.0–15.0)
Immature Granulocytes: 0 %
Lymphocytes Relative: 16 %
Lymphs Abs: 1.7 10*3/uL (ref 0.7–4.0)
MCH: 29.6 pg (ref 26.0–34.0)
MCHC: 33.9 g/dL (ref 30.0–36.0)
MCV: 87.2 fL (ref 80.0–100.0)
Monocytes Absolute: 0.8 10*3/uL (ref 0.1–1.0)
Monocytes Relative: 8 %
Neutro Abs: 8.1 10*3/uL — ABNORMAL HIGH (ref 1.7–7.7)
Neutrophils Relative %: 76 %
Platelets: 265 10*3/uL (ref 150–400)
RBC: 5.17 MIL/uL — ABNORMAL HIGH (ref 3.87–5.11)
RDW: 13 % (ref 11.5–15.5)
WBC: 10.7 10*3/uL — ABNORMAL HIGH (ref 4.0–10.5)
nRBC: 0 % (ref 0.0–0.2)

## 2021-05-21 LAB — URINALYSIS, ROUTINE W REFLEX MICROSCOPIC
Glucose, UA: 100 mg/dL — AB
Ketones, ur: 15 mg/dL — AB
Leukocytes,Ua: NEGATIVE
Nitrite: POSITIVE — AB
Protein, ur: 100 mg/dL — AB
Specific Gravity, Urine: >1.03 — ABNORMAL HIGH (ref 1.005–1.030)
pH: 5.5 (ref 5.0–8.0)

## 2021-05-21 LAB — POC URINE PREG, ED: Preg Test, Ur: NEGATIVE

## 2021-05-21 LAB — URINALYSIS, MICROSCOPIC (REFLEX)
Squamous Epithelial / HPF: NONE SEEN (ref 0–5)
WBC, UA: NONE SEEN WBC/hpf (ref 0–5)

## 2021-05-21 LAB — LIPASE, BLOOD: Lipase: 28 U/L (ref 11–51)

## 2021-05-21 MED ORDER — FENTANYL CITRATE PF 50 MCG/ML IJ SOSY
25.0000 ug | PREFILLED_SYRINGE | Freq: Once | INTRAMUSCULAR | Status: AC
  Administered 2021-05-21: 25 ug via INTRAVENOUS
  Filled 2021-05-21: qty 1

## 2021-05-21 MED ORDER — ONDANSETRON HCL 4 MG/2ML IJ SOLN
4.0000 mg | Freq: Once | INTRAMUSCULAR | Status: AC
  Administered 2021-05-21: 4 mg via INTRAVENOUS
  Filled 2021-05-21: qty 2

## 2021-05-21 MED ORDER — SODIUM CHLORIDE 0.9 % IV SOLN: INTRAVENOUS | Status: DC

## 2021-05-21 MED ORDER — CEPHALEXIN 500 MG PO CAPS: 500.0000 mg | ORAL_CAPSULE | Freq: Two times a day (BID) | ORAL | 0 refills | Status: AC

## 2021-05-21 MED ORDER — POTASSIUM CHLORIDE IN NACL 20-0.9 MEQ/L-% IV SOLN
Freq: Once | INTRAVENOUS | Status: AC
  Filled 2021-05-21: qty 1000

## 2021-05-21 MED ORDER — SODIUM CHLORIDE 0.9 % IV SOLN
1.0000 g | Freq: Once | INTRAVENOUS | Status: AC
  Administered 2021-05-21: 1 g via INTRAVENOUS
  Filled 2021-05-21: qty 10

## 2021-05-21 MED ORDER — FAMOTIDINE IN NACL 20-0.9 MG/50ML-% IV SOLN
20.0000 mg | Freq: Once | INTRAVENOUS | Status: AC
  Administered 2021-05-21: 20 mg via INTRAVENOUS
  Filled 2021-05-21: qty 50

## 2021-05-21 MED ORDER — SODIUM CHLORIDE 0.9 % IV BOLUS
1000.0000 mL | Freq: Once | INTRAVENOUS | Status: AC
  Administered 2021-05-21: 1000 mL via INTRAVENOUS

## 2021-05-21 MED ORDER — ONDANSETRON 4 MG PO TBDP: 4.0000 mg | ORAL_TABLET | Freq: Three times a day (TID) | ORAL | 0 refills | Status: DC | PRN

## 2021-05-21 NOTE — Discharge Instructions (Signed)
Please be sure to stay well-hydrated, monitor your condition carefully, and do not hesitate to return here for concerning changes in your condition. °

## 2021-05-21 NOTE — ED Provider Notes (Signed)
[facility] EMERGENCY DEPARTMENT Provider Note   CSN: [id number] Arrival date & time: 05/21/21  4098     History Chief Complaint  Patient presents with   Nausea    Karina Palmer is a 30 y.o. female.  HPI Patient presents with abdominal pain.  She has associated nausea, vomiting, lack of oral intake due to pain which is sore, severe, upper abdomen, with diffuse radiation.  No relief with OTC medication, nor ability to take medication.  Onset was 4 days ago, no obvious precipitant.  She notes that she has had similar episodes intermittently, going back years, though the most recent one was about 2 years ago. No fever, no diarrhea, no chest pain.    Past Medical History:  Diagnosis Date   ADHD (attention deficit hyperactivity disorder)    Bipolar 1 disorder (HCC)    Insomnia    Menstrual cramps 07/09/2014   Menstrual extraction 07/06/2013    Patient Active Problem List   Diagnosis Date Noted   Encounter for gynecological examination with Papanicolaou smear of cervix 10/19/2019   Dysmenorrhea 09/18/2019   Encounter for menstrual regulation 09/18/2019   Menstrual cramps 07/09/2014   Menstrual extraction 07/06/2013    History reviewed. No pertinent surgical history.   OB History     Gravida  0   Para  0   Term  0   Preterm  0   AB  0   Living  0      SAB  0   IAB  0   Ectopic  0   Multiple  0   Live Births              Family History  Problem Relation Age of Onset   Other Mother        problems with ovaries    Social History   Tobacco Use   Smoking status: Some Days    Packs/day: 0.00    Years: 10.00    Pack years: 0.00    Types: Cigars, Cigarettes    Last attempt to quit: 06/17/2016    Years since quitting: 4.9   Smokeless tobacco: Never   Tobacco comments:    smokes 2 cigars daily  Vaping Use   Vaping Use: Never used  Substance Use Topics   Alcohol use: Yes    Comment: occ   Drug use: No    Home Medications Prior to  Admission medications   Medication Sig Start Date End Date Taking? Authorizing Provider  beclomethasone (QVAR) 80 MCG/ACT inhaler Inhale 2 puffs into the lungs daily as needed (for shortness of breath/wheezing).   Yes [provider]  ibuprofen (ADVIL) 800 MG tablet Take 1 tablet (800 mg total) by mouth 3 (three) times daily. Take with food 03/04/21  Yes Triplett, Tammy, PA-C  LORazepam (ATIVAN) 1 MG tablet Take 1 mg by mouth at bedtime. 03/11/21  Yes [provider]    Allergies    Patient has no known allergies.  Review of Systems   Review of Systems  Constitutional:        Per HPI, otherwise negative  HENT:         Per HPI, otherwise negative  Respiratory:         Per HPI, otherwise negative  Cardiovascular:        Per HPI, otherwise negative  Gastrointestinal:  Positive for abdominal pain, nausea and vomiting.  Endocrine:       Negative aside from HPI  Genitourinary:  Neg aside from HPI   Musculoskeletal:        Per HPI, otherwise negative  Skin: Negative.   Neurological:  Negative for syncope.   Physical Exam Updated Vital Signs BP (!) 145/95   Pulse 85   Temp 98.6 F (37 C) (Oral)   Resp 16   Ht 5\' 6"  (1.676 m)   Wt 45.4 kg   SpO2 100%   BMI 16.14 kg/m   Physical Exam Vitals and nursing note reviewed.  Constitutional:      General: She is not in acute distress.    Appearance: She is well-developed.  HENT:     Head: Normocephalic and atraumatic.  Eyes:     Conjunctiva/sclera: Conjunctivae normal.  Cardiovascular:     Rate and Rhythm: Normal rate and regular rhythm.  Pulmonary:     Effort: Pulmonary effort is normal. No respiratory distress.     Breath sounds: Normal breath sounds. No stridor.  Abdominal:     General: There is no distension.     Comments: No guarding, peritonitis.  Tenderness palpation in the epigastric region.  Skin:    General: Skin is warm and dry.  Neurological:     Mental Status: She is alert and oriented  to person, place, and time.     Cranial Nerves: No cranial nerve deficit.    ED Results / Procedures / Treatments   Labs (all labs ordered are listed, but only abnormal results are displayed) Labs Reviewed  COMPREHENSIVE METABOLIC PANEL - Abnormal; Notable for the following components:      Result Value   Potassium 2.8 (*)    Glucose, Bld 123 (*)    Total Protein 8.8 (*)    AST 80 (*)    ALT 60 (*)    Total Bilirubin 1.4 (*)    All other components within normal limits  CBC WITH DIFFERENTIAL/PLATELET - Abnormal; Notable for the following components:   WBC 10.7 (*)    RBC 5.17 (*)    Hemoglobin 15.3 (*)    Neutro Abs 8.1 (*)    All other components within normal limits  URINALYSIS, ROUTINE W REFLEX MICROSCOPIC - Abnormal; Notable for the following components:   Color, Urine AMBER (*)    Specific Gravity, Urine >1.030 (*)    Glucose, UA 100 (*)    Hgb urine dipstick LARGE (*)    Bilirubin Urine MODERATE (*)    Ketones, ur 15 (*)    Protein, ur 100 (*)    Nitrite POSITIVE (*)    All other components within normal limits  URINALYSIS, MICROSCOPIC (REFLEX) - Abnormal; Notable for the following components:   Bacteria, UA MANY (*)    All other components within normal limits  LIPASE, BLOOD  POC URINE PREG, ED      Procedures Procedures   Medications Ordered in ED Medications  sodium chloride 0.9 % bolus 1,000 mL (0 mLs Intravenous Stopped 05/21/21 1046)    And  0.9 %  sodium chloride infusion (0 mLs Intravenous Stopped 05/21/21 1222)  0.9 % NaCl with KCl 20 mEq/ L  infusion ( Intravenous New Bag/Given 05/21/21 1401)  fentaNYL (SUBLIMAZE) injection 25 mcg (25 mcg Intravenous Given 05/21/21 0934)  ondansetron (ZOFRAN) injection 4 mg (4 mg Intravenous Given 05/21/21 0935)  famotidine (PEPCID) IVPB 20 mg premix (0 mg Intravenous Stopped 05/21/21 1010)  0.9 % NaCl with KCl 20 mEq/ L  infusion (0 mL/hr Intravenous Stopped 05/21/21 1244)  cefTRIAXone (ROCEPHIN) 1 g in sodium  chloride  0.9 % 100 mL IVPB (0 g Intravenous Stopped 05/21/21 1433)  ondansetron (ZOFRAN) injection 4 mg (4 mg Intravenous Given 05/21/21 1407)    ED Course  I have reviewed the triage vital signs and the nursing notes.  Pertinent labs & imaging results that were available during my care of the patient were reviewed by me and considered in my medical decision making (see chart for details).  Pulse ox 100% room air normal Patient received fluids, antiemetics after my initial evaluation  Now, after initial resuscitation with fluids, potassium she feels somewhat better, is tolerating oral.  No evidence for bacteremia, sepsis though she has mild leukocytosis she is afebrile, hemodynamically reassuring.  Now, after 2 L, 40 mEq of potassium, antibiotics for urinary tract infection, patient is awake, alert, tolerating oral.  No evidence for bacteremia, sepsis, with appropriate repletion of her potassium, patient appropriate for discharge with outpatient follow-up. MDM Rules/Calculators/A&P MDM Number of Diagnoses or Management Options Bilious vomiting with nausea: new, needed workup Hypokalemia: new, needed workup Lower urinary tract infectious disease: new, needed workup   Amount and/or Complexity of Data Reviewed Clinical lab tests: ordered and reviewed Tests in the medicine section of CPT: reviewed and ordered Decide to obtain previous medical records or to obtain history from someone other than the patient: yes Review and summarize past medical records: yes Independent visualization of images, tracings, or specimens: yes  Risk of Complications, Morbidity, and/or Mortality Presenting problems: high Diagnostic procedures: high Management options: high  Critical Care Total time providing critical care: < 30 minutes  Patient Progress Patient progress: improved   Final Clinical Impression(s) / ED Diagnoses Final diagnoses:  Lower urinary tract infectious disease  Bilious vomiting  with nausea  Hypokalemia     Gerhard Munch, MD 05/21/21 1538

## 2021-05-21 NOTE — ED Triage Notes (Signed)
Patient with complaints of nausea, vomiting, and shortness of breath. Initially had diarrhea but that has resolved.

## 2021-05-23 ENCOUNTER — Ambulatory Visit: Payer: Medicaid Other | Admitting: Orthopedic Surgery

## 2021-05-30 ENCOUNTER — Ambulatory Visit (INDEPENDENT_AMBULATORY_CARE_PROVIDER_SITE_OTHER): Payer: Medicaid Other | Admitting: Orthopedic Surgery

## 2021-05-30 ENCOUNTER — Encounter: Payer: Self-pay | Admitting: Orthopedic Surgery

## 2021-05-30 ENCOUNTER — Other Ambulatory Visit: Payer: Self-pay

## 2021-05-30 VITALS — BP 118/80 | HR 73 | Ht 66.0 in | Wt 100.0 lb

## 2021-05-30 DIAGNOSIS — M65341 Trigger finger, right ring finger: Secondary | ICD-10-CM

## 2021-05-30 NOTE — Progress Notes (Signed)
Orthopaedic Clinic Return  Assessment: Karina Palmer is a 30 y.o. female with the following: Right ring finger trigger finger; in setting of recent trauma  Plan: Patient's range of motion continues to improve following the fracture.  However, she notices a catching sensation and pain at the base of the right ring finger.  Has previously discussed, she has trigger finger, and can proceed with an injection.  She would like to have the injection done today.  The etiology, treatment and potential surgical intervention were all discussed with the patient today.  She will update the office if she has any issues.  Procedure note injection - Right Ring finger A1 Pulley  Verbal consent was obtained to inject the Right Ring finger A1 pulley Timeout was completed to confirm the site of injection.  The skin was prepped with alcohol and ethyl chloride was sprayed at the injection site.  A 21-gauge needle was used to inject 6 mg of Betamethasone and 1% lidocaine (1 cc) into the Right Ring finger using a direct anterior approach.  There were no complications. Patient tolerated the procedure well. A sterile bandage was applied   No orders of the defined types were placed in this encounter.   Body mass index is 16.14 kg/m.  Follow-up: Return if symptoms worsen or fail to improve.   Subjective:  Chief Complaint  Patient presents with   Hand Pain    Rt hand pain and triggering for 2-3 wks.     History of Present Illness: Karina Palmer is a 30 y.o. female who returns to clinic for repeat evaluation of her right ring finger.  Briefly, she previously sustained a fracture of the proximal phalanx to the right ring finger.  This was treated without surgery.  She has since started to develop a trigger finger at the right ring finger.  She notes pain over the A1 pulley.  She also notes catching sensations.  She is trying to stretch out her finger, and she is also been working on range of motion  following the injury.  At the previous clinic visit, we did discuss proceeding with an injection.  She is interested in clinic today.  Review of Systems: No fevers or chills No numbness or tingling No chest pain No shortness of breath No bowel or bladder dysfunction No GI distress No headaches   Objective: BP 118/80   Pulse 73   Ht 5\' 6"  (1.676 m)   Wt 100 lb (45.4 kg)   BMI 16.14 kg/m   Physical Exam:  Alert and oriented.  No acute distress.  Evaluation the right hand demonstrates no deformity.  No bruising is appreciated.  No swelling.  She can make close to full fist with the right ring and small fingers causing some discomfort.  She has tenderness to palpation over the A1 pulley.  No active triggering is witnessed in clinic today.  Fingers are warm and well-perfused.  Sensation is intact throughout the hand.  IMAGING: I personally ordered and reviewed the following images:  No new imaging obtained today.  , MD 05/30/2021 8:43 PM

## 2021-05-30 NOTE — Patient Instructions (Signed)

## 2021-12-29 ENCOUNTER — Telehealth: Payer: Self-pay | Admitting: Adult Health

## 2021-12-29 MED ORDER — MEGESTROL ACETATE 40 MG PO TABS: ORAL_TABLET | ORAL | 1 refills | Status: DC

## 2021-12-29 NOTE — Telephone Encounter (Signed)
Refilled megace  

## 2021-12-29 NOTE — Telephone Encounter (Signed)
Patient would like a refill on her megestrol/ Please advise.

## 2021-12-29 NOTE — Telephone Encounter (Signed)
Pt is requesting refills on Megace. Thanks!! JSY

## 2021-12-29 NOTE — Addendum Note (Signed)
Addended by: Cyril Mourning A on: 12/29/2021 12:38 PM   Modules accepted: Orders

## 2022-06-23 DIAGNOSIS — Z681 Body mass index (BMI) 19 or less, adult: Secondary | ICD-10-CM | POA: Diagnosis not present

## 2022-06-23 DIAGNOSIS — R030 Elevated blood-pressure reading, without diagnosis of hypertension: Secondary | ICD-10-CM | POA: Diagnosis not present

## 2022-06-23 DIAGNOSIS — J019 Acute sinusitis, unspecified: Secondary | ICD-10-CM | POA: Diagnosis not present

## 2022-08-20 ENCOUNTER — Encounter: Payer: Self-pay | Admitting: Radiology

## 2022-09-22 ENCOUNTER — Other Ambulatory Visit: Payer: Self-pay | Admitting: Adult Health

## 2022-09-28 ENCOUNTER — Ambulatory Visit: Payer: Medicaid Other | Admitting: Adult Health

## 2022-09-29 ENCOUNTER — Encounter: Payer: Self-pay | Admitting: Adult Health

## 2022-09-29 ENCOUNTER — Other Ambulatory Visit (HOSPITAL_COMMUNITY)
Admission: RE | Admit: 2022-09-29 | Discharge: 2022-09-29 | Disposition: A | Discharge status: Home / Self Care | Payer: Medicaid Other | Source: Ambulatory Visit | Attending: Adult Health | Admitting: Adult Health

## 2022-09-29 ENCOUNTER — Ambulatory Visit (INDEPENDENT_AMBULATORY_CARE_PROVIDER_SITE_OTHER): Payer: Medicaid Other | Admitting: Adult Health

## 2022-09-29 VITALS — BP 101/65 | HR 77 | Ht 65.0 in | Wt 114.5 lb

## 2022-09-29 DIAGNOSIS — N946 Dysmenorrhea, unspecified: Secondary | ICD-10-CM

## 2022-09-29 DIAGNOSIS — F32A Depression, unspecified: Secondary | ICD-10-CM

## 2022-09-29 DIAGNOSIS — F419 Anxiety disorder, unspecified: Secondary | ICD-10-CM | POA: Insufficient documentation

## 2022-09-29 DIAGNOSIS — Z0000 Encounter for general adult medical examination without abnormal findings: Secondary | ICD-10-CM

## 2022-09-29 DIAGNOSIS — Z7689 Persons encountering health services in other specified circumstances: Secondary | ICD-10-CM

## 2022-09-29 MED ORDER — MEGESTROL ACETATE 40 MG PO TABS: ORAL_TABLET | ORAL | 12 refills | Status: DC

## 2022-09-29 NOTE — Progress Notes (Signed)
Patient ID: Karina Palmer, female   DOB: 1991-06-15, 32 y.o.   MRN: [id number] History of Present Illness: Karina Palmer is a 32 year old black female, single, G0P0, in for a well woman gyn exam and pap. She is requesting refill on megace, has pain with periods, and megace helps. She has female partner. She is not feeling good at times.  PCP is Dr Sudie Bailey   Current Medications, Allergies, Past Medical History, Past Surgical History, Family History and Social History were reviewed in [employer] electronic medical record.     Review of Systems: Patient denies any headaches, hearing loss, fatigue, blurred vision, shortness of breath, chest pain, abdominal pain, problems with bowel movements, urination, or intercourse.(Has female partner). No joint pain or mood swings.  Has pain with periods, wants megace to stop periods and pain.    Physical Exam:BP 101/65 (BP Location: Left Arm, Patient Position: Sitting, Cuff Size: Normal)   Pulse 77   Ht 5\' 5"  (1.651 m)   Wt 114 lb 8 oz (51.9 kg)   LMP 09/26/2022   BMI 19.05 kg/m   General:  Well developed, well nourished, no acute distress Skin:  Warm and dry Neck:  Midline trachea, normal thyroid, good ROM, no lymphadenopathy Lungs; Clear to auscultation bilaterally Breast:  No dominant palpable mass, retraction, or nipple discharge Cardiovascular: Regular rate and rhythm Abdomen:  Soft, non tender, no hepatosplenomegaly Pelvic:  External genitalia is normal in appearance, no lesions.  The vagina is normal in appearance. Urethra has no lesions or masses. The cervix is smooth and nulliparous, pap with HR HPV genotyping performed.  Uterus is felt to be normal size, shape, and contour.  No adnexal masses or tenderness noted.Bladder is non tender, no masses felt. Extremities/musculoskeletal:  No swelling or varicosities noted, no clubbing or cyanosis Psych:  No mood changes, alert and cooperative,seems happy AA is 4 Fall risk is low    09/29/2022    11:46 AM 10/19/2019    2:50 PM 09/18/2019    3:27 PM  Depression screen PHQ 2/9  Decreased Interest 2 3 0  Down, Depressed, Hopeless 2 3 0  PHQ - 2 Score 4 6 0  Altered sleeping 2 3   Tired, decreased energy 2 3   Change in appetite 2 0   Feeling bad or failure about yourself  2 1   Trouble concentrating 2 2   Moving slowly or fidgety/restless 2 0   Suicidal thoughts 2 0   PHQ-9 Score 18 15        09/29/2022   11:46 AM 10/19/2019    2:50 PM  GAD 7 : Generalized Anxiety Score  Nervous, Anxious, on Edge 2 0  Control/stop worrying 2 1  Worry too much - different things 2 1  Trouble relaxing 2 1  Restless 2 0  Easily annoyed or irritable 2 1  Afraid - awful might happen 2 0  Total GAD 7 Score 14 4    Upstream - 09/29/22 1150       Pregnancy Intention Screening   Does the patient want to become pregnant in the next year? No    Does the patient's partner want to become pregnant in the next year? No    Would the patient like to discuss contraceptive options today? No      Contraception Wrap Up   Current Method No Method - Other Reason    Reason for No Current Contraceptive Method at Intake ([facility] Only) Other    End  Method No Method - Other Reason            Examination chaperoned by Malachy Mood LPN     Impression and Plan: 1. Routine general medical examination at a health care facility Pap sent Pap in 3 years if normal Physical in 1 year See PCP for labs and she agrees  - Cytology - PAP( [place])  2. Dysmenorrhea Has pain with periods, will refill megace, this works for her Meds ordered this encounter  Medications   megestrol (MEGACE) 40 MG tablet    Sig: Take 2 megace daily    Dispense:  60 tablet    Refill:  12    Order Specific Question:   Supervising Provider    Answer:   Despina Hidden, LUTHER H [[id number]]     3. Encounter for menstrual regulation Will refill megace to stop bleeding   4. Anxiety and depression She is not on any meds, she says she does not  have any SI or HI Will make appointment with Dr Sudie Bailey she says

## 2022-10-01 LAB — CYTOLOGY - PAP
Comment: NEGATIVE
Diagnosis: NEGATIVE
High risk HPV: NEGATIVE

## 2022-10-06 IMAGING — DX DG KNEE COMPLETE 4+V*R*
4 series · 4 of 4 positions shown · non-contrast
Comparison: None.

CLINICAL DATA: Pain, felt popping sensation

EXAM:
RIGHT KNEE - COMPLETE 4+ VIEW

[knee ap]
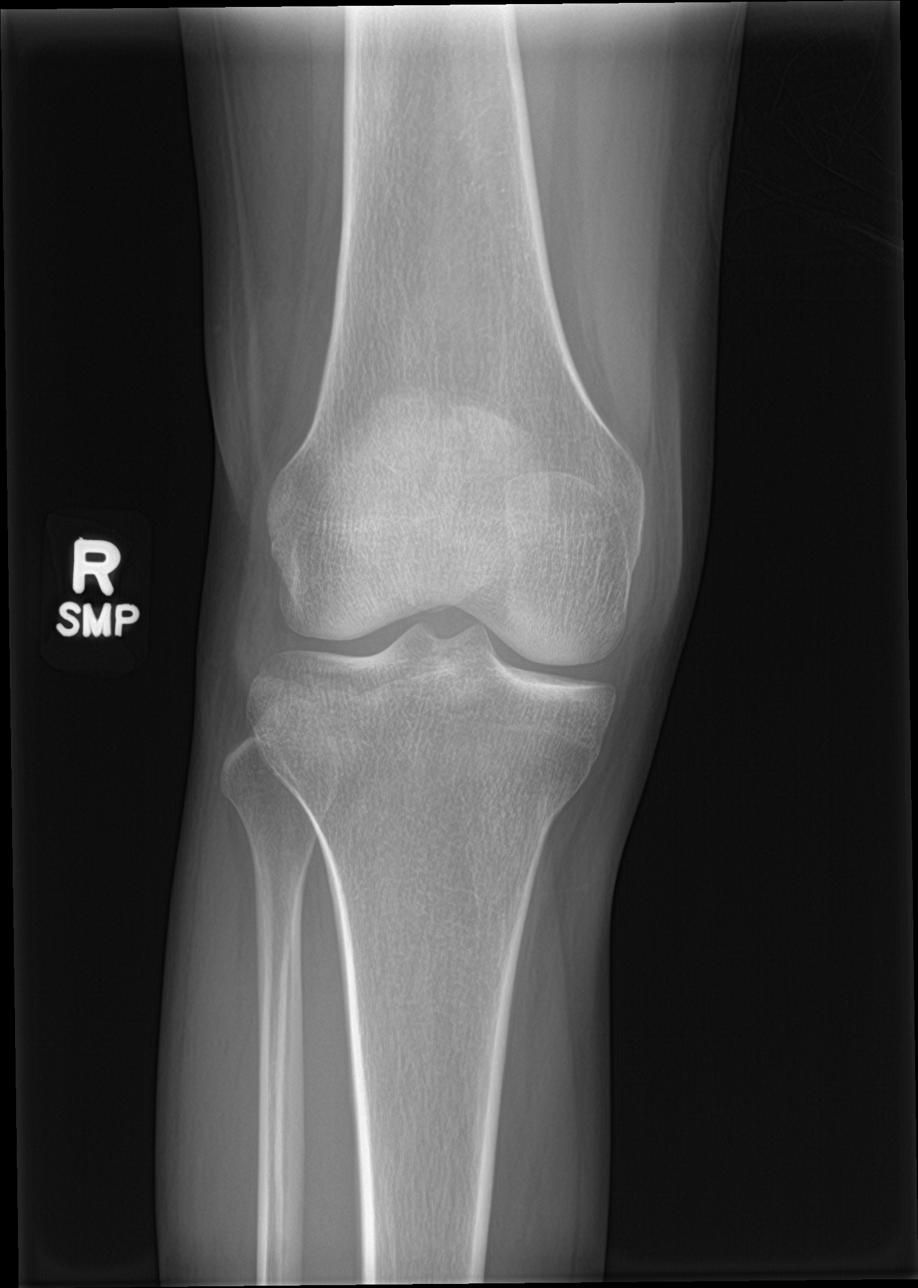

[knee obl (1 of 2)]
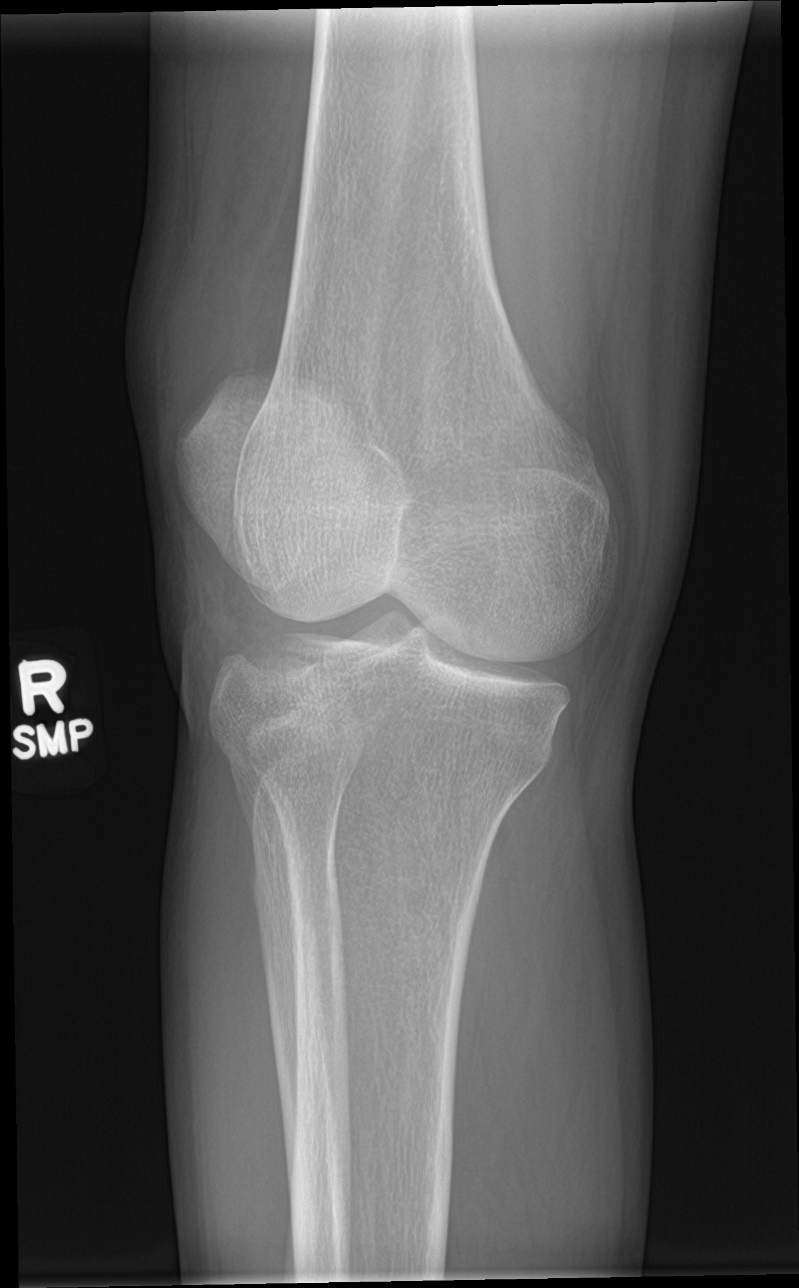

[knee obl (2 of 2)]
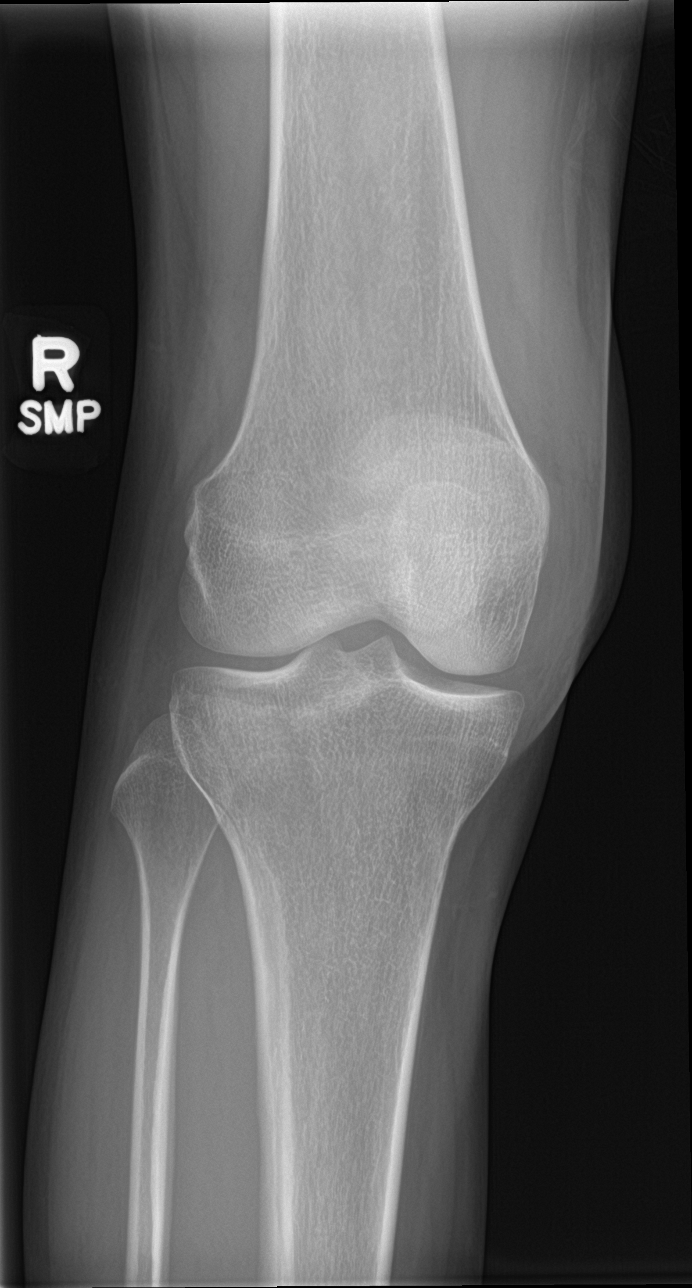

[knee lat]
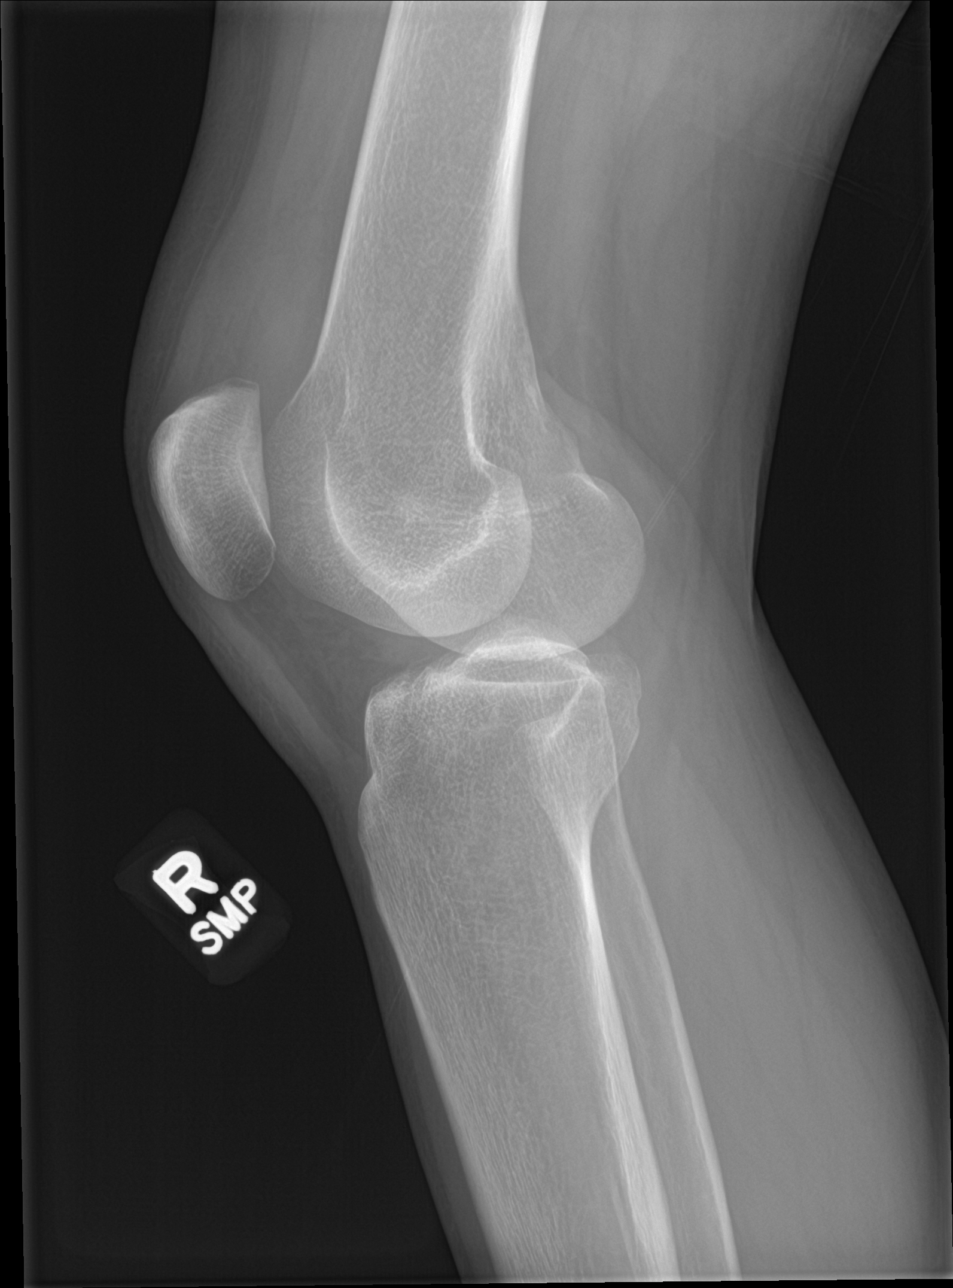

[4 of 4 positions shown; findings below may reference images not displayed]

FINDINGS: No fracture or dislocation of the right knee. The joint spaces are
well preserved. There is a moderate, nonspecific knee joint
effusion. Soft tissues are unremarkable.
IMPRESSION: 1. No fracture or dislocation of the right knee. The joint spaces
are well preserved.

2.  There is a moderate, nonspecific knee joint effusion.

## 2022-10-07 ENCOUNTER — Telehealth: Payer: Self-pay | Admitting: *Deleted

## 2022-10-07 NOTE — Telephone Encounter (Signed)
Pt aware pap was negative for HPV and malignancy. Next pap due in 3 years. Pt voiced understanding. JSY 

## 2023-07-05 ENCOUNTER — Telehealth: Payer: Self-pay

## 2023-07-05 NOTE — Telephone Encounter (Signed)
 Mailbox full

## 2023-07-05 NOTE — Telephone Encounter (Signed)
 Patient called and stated that her insurance will no longer cover megestrol and wants to know if there is an alternative.  Patient would like a call back she does not have access to her Mychart

## 2023-07-13 ENCOUNTER — Encounter: Payer: Self-pay | Admitting: Adult Health

## 2023-07-13 ENCOUNTER — Ambulatory Visit: Payer: Medicaid Other | Admitting: Adult Health

## 2023-07-13 VITALS — BP 110/73 | HR 98 | Ht 65.0 in | Wt 116.0 lb

## 2023-07-13 DIAGNOSIS — Z7689 Persons encountering health services in other specified circumstances: Secondary | ICD-10-CM | POA: Diagnosis not present

## 2023-07-13 DIAGNOSIS — N946 Dysmenorrhea, unspecified: Secondary | ICD-10-CM

## 2023-07-13 MED ORDER — NORETHINDRONE 0.35 MG PO TABS: 1.0000 | ORAL_TABLET | Freq: Every day | ORAL | 11 refills | Status: AC

## 2023-07-13 NOTE — Progress Notes (Addendum)
  Subjective:     Patient ID: Karina Palmer, female   DOB: September 05, 1990, 33 y.o.   MRN: [id number]  HPI Karina Palmer is a 33 year old black female, G0P0, in saying megace not covered with her insurance was $60 at [employer]. She has painful periods, will have nausea and vomiting at times, if not taking. Was better on Megace.     Component Value Date/Time   DIAGPAP  09/29/2022 1200    - Negative for intraepithelial lesion or malignancy (NILM)   DIAGPAP  10/19/2019 1522    - Negative for intraepithelial lesion or malignancy (NILM)   HPVHIGH Negative 09/29/2022 1200   HPVHIGH Negative 10/19/2019 1522   ADEQPAP  09/29/2022 1200    Satisfactory for evaluation; transformation zone component PRESENT.   ADEQPAP  10/19/2019 1522    Satisfactory for evaluation; transformation zone component PRESENT.   Review of Systems +painful periods, was much better on megace    Reviewed past medical,surgical, social and family history. Reviewed medications and allergies.  Objective:   Physical Exam BP 110/73 (BP Location: Right Arm, Patient Position: Sitting, Cuff Size: Normal)   Pulse 98   Ht 5\' 5"  (1.651 m)   Wt 116 lb (52.6 kg)   BMI 19.30 kg/m     Skin warm and dry. Lungs: clear to ausculation bilaterally. Cardiovascular: regular rate and rhythm.   Upstream - 07/13/23 1416       Pregnancy Intention Screening   Does the patient want to become pregnant in the next year? No    Does the patient's partner want to become pregnant in the next year? No    Would the patient like to discuss contraceptive options today? No      Contraception Wrap Up   Current Method No Method - Other Reason;Abstinence   female partner   End Method Abstinence;No Method - Other Reason   female partner   Contraception Counseling Provided No             Assessment:     1. Dysmenorrhea (Primary) Called [facility] and Megace woith [facility] still $30, she wants to try micronor If has sex with female,use condoms  Will  try micronor Meds ordered this encounter  Medications   norethindrone (MICRONOR) 0.35 MG tablet    Sig: Take 1 tablet (0.35 mg total) by mouth daily.    Dispense:  28 tablet    Refill:  11    Supervising Provider:   Duane Lope H [[id number]]   Review handout on endometrial ablation has option  2. Encounter to establish care Refer to Gilmore Laroche NP - Ambulatory Referral to Primary Care     Plan:     Follow up prn

## 2023-11-04 ENCOUNTER — Ambulatory Visit: Admitting: Family Medicine

## 2023-11-10 ENCOUNTER — Encounter: Payer: Self-pay | Admitting: Family Medicine

## 2024-04-17 ENCOUNTER — Encounter (HOSPITAL_COMMUNITY): Payer: Self-pay

## 2024-04-17 ENCOUNTER — Emergency Department (HOSPITAL_COMMUNITY)

## 2024-04-17 ENCOUNTER — Other Ambulatory Visit: Payer: Self-pay

## 2024-04-17 ENCOUNTER — Emergency Department (HOSPITAL_COMMUNITY)
Admission: EM | Admit: 2024-04-17 | Discharge: 2024-04-18 | Disposition: A | Discharge status: Home / Self Care | Attending: Emergency Medicine | Admitting: Emergency Medicine

## 2024-04-17 DIAGNOSIS — R079 Chest pain, unspecified: Secondary | ICD-10-CM | POA: Diagnosis not present

## 2024-04-17 DIAGNOSIS — Z79899 Other long term (current) drug therapy: Secondary | ICD-10-CM | POA: Insufficient documentation

## 2024-04-17 DIAGNOSIS — R918 Other nonspecific abnormal finding of lung field: Secondary | ICD-10-CM | POA: Diagnosis not present

## 2024-04-17 DIAGNOSIS — F17290 Nicotine dependence, other tobacco product, uncomplicated: Secondary | ICD-10-CM | POA: Diagnosis not present

## 2024-04-17 DIAGNOSIS — R0789 Other chest pain: Secondary | ICD-10-CM | POA: Diagnosis not present

## 2024-04-17 DIAGNOSIS — R0602 Shortness of breath: Secondary | ICD-10-CM | POA: Diagnosis not present

## 2024-04-17 DIAGNOSIS — R7309 Other abnormal glucose: Secondary | ICD-10-CM | POA: Insufficient documentation

## 2024-04-17 DIAGNOSIS — R739 Hyperglycemia, unspecified: Secondary | ICD-10-CM

## 2024-04-17 LAB — CBC
HCT: 38.7 % (ref 36.0–46.0)
Hemoglobin: 12.9 g/dL (ref 12.0–15.0)
MCH: 29.8 pg (ref 26.0–34.0)
MCHC: 33.3 g/dL (ref 30.0–36.0)
MCV: 89.4 fL (ref 80.0–100.0)
Platelets: 226 K/uL (ref 150–400)
RBC: 4.33 MIL/uL (ref 3.87–5.11)
RDW: 14.4 % (ref 11.5–15.5)
WBC: 8 K/uL (ref 4.0–10.5)
nRBC: 0 % (ref 0.0–0.2)

## 2024-04-17 LAB — BASIC METABOLIC PANEL WITH GFR
Anion gap: 11 (ref 5–15)
BUN: 6 mg/dL (ref 6–20)
CO2: 24 mmol/L (ref 22–32)
Calcium: 8.7 mg/dL — ABNORMAL LOW (ref 8.9–10.3)
Chloride: 103 mmol/L (ref 98–111)
Creatinine, Ser: 0.56 mg/dL (ref 0.44–1.00)
GFR, Estimated: >60 mL/min (ref >60)
Glucose, Bld: 129 mg/dL — ABNORMAL HIGH (ref 70–99)
Potassium: 3.6 mmol/L (ref 3.5–5.1)
Sodium: 137 mmol/L (ref 135–145)

## 2024-04-17 LAB — TROPONIN T, HIGH SENSITIVITY: Troponin T High Sensitivity: <15 ng/L (ref 0–19)

## 2024-04-17 LAB — D-DIMER, QUANTITATIVE: D-Dimer, Quant: 0.42 ug{FEU}/mL (ref 0.00–0.50)

## 2024-04-17 MED ORDER — ACETAMINOPHEN 325 MG PO TABS
650.0000 mg | ORAL_TABLET | Freq: Once | ORAL | Status: DC
  Filled 2024-04-17: qty 2

## 2024-04-17 MED ORDER — IBUPROFEN 400 MG PO TABS
600.0000 mg | ORAL_TABLET | Freq: Once | ORAL | Status: DC
  Filled 2024-04-17: qty 2

## 2024-04-17 MED ORDER — KETOROLAC TROMETHAMINE 60 MG/2ML IM SOLN
60.0000 mg | Freq: Once | INTRAMUSCULAR | Status: AC
  Administered 2024-04-17: 60 mg via INTRAMUSCULAR
  Filled 2024-04-17: qty 2

## 2024-04-17 NOTE — ED Provider Notes (Signed)
  EMERGENCY DEPARTMENT AT [facility] Provider Note   CSN: [id number] Arrival date & time: 04/17/24  2103     Patient presents with: Chest Pain   Karina Palmer is a 33 y.o. female.  {Add pertinent medical, surgical, social history, OB history to YEP:[id number]} The history is provided by the patient.  Chest Pain  She has history of bipolar disorder, attention deficit disorder and comes in because of left-sided chest pain which started yesterday.  She describes a dull pain which seems to be worse when she twists to that side.  There is no radiation of pain.  She denies dyspnea, nausea, diaphoresis.  Denies fever or chills or cough.  She denies history of hypertension, diabetes, hyperlipidemia but she does smoke Black and mild cigars.  There is no family history of premature coronary atherosclerosis.  She denies travel history or surgery history but does take birth control pills.    Prior to Admission medications   Medication Sig Start Date End Date Taking? Authorizing Provider  beclomethasone (QVAR) 80 MCG/ACT inhaler Inhale 2 puffs into the lungs daily as needed (for shortness of breath/wheezing).    [provider]  norethindrone  (MICRONOR ) 0.35 MG tablet Take 1 tablet (0.35 mg total) by mouth daily. 07/13/23   Signa Delon LABOR, NP    Allergies: Patient has no known allergies.    Review of Systems  Cardiovascular:  Positive for chest pain.  All other systems reviewed and are negative.   Updated Vital Signs BP 120/84 (BP Location: Right Arm)   Pulse 85   Temp 98.9 F (37.2 C) (Oral)   Resp 18   Ht 5' 5 (1.651 m)   Wt 49.9 kg   SpO2 99%   BMI 18.30 kg/m   Physical Exam Vitals and nursing note reviewed.   33 year old female, resting comfortably and in no acute distress. Vital signs are normal. Oxygen saturation is 99%, which is normal. Head is normocephalic and atraumatic. PERRLA, EOMI. Lungs are clear without rales, wheezes, or  rhonchi. Chest is mildly tender in the left parasternal area.  There is no crepitus. Heart has regular rate and rhythm without murmur. Abdomen is soft, flat, nontender. Skin is warm and dry without rash. Neurologic: Mental status is normal, cranial nerves are intact, moves all extremities equally.  (all labs ordered are listed, but only abnormal results are displayed) Labs Reviewed  BASIC METABOLIC PANEL WITH GFR - Abnormal; Notable for the following components:      Result Value   Glucose, Bld 129 (*)    Calcium 8.7 (*)    All other components within normal limits  CBC  D-DIMER, QUANTITATIVE  POC URINE PREG, ED  TROPONIN T, HIGH SENSITIVITY  TROPONIN T, HIGH SENSITIVITY    EKG: EKG Interpretation Date/Time:  Monday April 17 2024 21:11:05 EDT Ventricular Rate:  77 PR Interval:  120 QRS Duration:  84 QT Interval:  378 QTC Calculation: 427 R Axis:   90  Text Interpretation: Normal sinus rhythm Rightward axis Borderline ECG No previous ECGs available Confirmed by Raford Lenis ([id number]) on 04/17/2024 10:52:46 PM  Radiology: DG Chest 2 View Result Date: 04/17/2024 CLINICAL DATA:  Chest pain.  Shortness of breath. EXAM: CHEST - 2 VIEW COMPARISON:  09/09/2011 FINDINGS: The cardiomediastinal contours are normal. Bronchial thickening. Pulmonary vasculature is normal. No consolidation, pleural effusion, or pneumothorax. No acute osseous abnormalities are seen. IMPRESSION: Bronchial thickening. Electronically Signed   By: Andrea Gasman M.D.   On: 04/17/2024  22:13   Cardiac monitor shows normal sinus rhythm, per my interpretation. Procedures   Medications Ordered in the ED  ibuprofen  (ADVIL ) tablet 600 mg (600 mg Oral Patient Refused/Not Given 04/17/24 2305)  acetaminophen (TYLENOL) tablet 650 mg (650 mg Oral Patient Refused/Not Given 04/17/24 2305)      {Click here for ABCD2, HEART and other calculators REFRESH Note before signing:1}          HEART Score: 1                   PERC Score: 1, PERC Score Interpretation: If any criteria are positive, the PERC rule cannot be used to rule out PE in this patient Medical Decision Making Amount and/or Complexity of Data Reviewed Labs: ordered. Radiology: ordered.   Left-sided chest pain.  This is a presentation with wide range of treatment options and carries with it a high risk of morbidity and complications.  Differential diagnosis includes, but is not limited to, costochondritis, ACS, pericarditis, pleurisy, pneumonia, intercostal muscle strain.  I have reviewed her electrocardiogram, and my interpretation is borderline rightward axis with normal ST-T waves.  Chest x-ray shows bronchial thickening but otherwise unremarkable.  I independently viewed the images, and agree with the radiologist's interpretation.  I have reviewed her laboratory tests, and my interpretation is elevated random glucose level which will need to be followed as an outpatient, normal CBC, normal initial troponin.  I am ordering a D-dimer since she is at risk for pulmonary embolism because of birth control pill use.  Repeat troponin is also pending.  I have ordered a dose of ibuprofen  and acetaminophen.  I have reviewed her past records, and see no relevant past visits.  Heart score is 1, which puts her at low risk for major adverse cardiac events in the next 6 weeks.  Unable to rule out pulmonary embolism by [facility] criteria.  {Document critical care time when appropriate  Document review of labs and clinical decision tools ie CHADS2VASC2, etc  Document your independent review of radiology images and any outside records  Document your discussion with family members, caretakers and with consultants  Document social determinants of health affecting pt's care  Document your decision making why or why not admission, treatments were needed:[id number]:::1}   Final diagnoses:  None    ED Discharge Orders     None

## 2024-04-17 NOTE — ED Triage Notes (Signed)
 Pt arrives with c/o chest pain that started yesterday. Pt denies SOB or n/v. Pt reports pain is worse with deep breathing or movement.

## 2024-04-17 NOTE — ED Notes (Signed)
 ED Provider at bedside.

## 2024-04-18 LAB — TROPONIN T, HIGH SENSITIVITY: Troponin T High Sensitivity: <15 ng/L (ref 0–19)

## 2024-04-18 NOTE — ED Notes (Signed)
Patient verbalizes understanding of discharge instructions. Opportunity for questioning and answers were provided. Armband removed by staff, pt discharged from ED. Ambulated out to lobby  

## 2024-04-18 NOTE — Discharge Instructions (Addendum)
 Your evaluation did not show any sign of anything serious.  This is likely some inflammation in your rib cartilage.  You may apply ice.  Ice should be applied for 30 minutes at a time, 4 times a day.  You may take acetaminophen and/or ibuprofen  as needed for pain.  Please note that acetaminophen and ibuprofen  work on pain in different ways, and the combination gives you better pain relief that you get from taking other medication by itself.
